# Patient Record
Sex: Female | Born: 1935
Health system: Southern US, Community
[De-identification: ages and names within clinical notes are randomized; demographics above are authoritative.]

## PROBLEM LIST (undated history)

## (undated) DIAGNOSIS — B019 Varicella without complication: Secondary | ICD-10-CM

## (undated) DIAGNOSIS — K5790 Diverticulosis of intestine, part unspecified, without perforation or abscess without bleeding: Secondary | ICD-10-CM

## (undated) DIAGNOSIS — I10 Essential (primary) hypertension: Secondary | ICD-10-CM

## (undated) DIAGNOSIS — L920 Granuloma annulare: Secondary | ICD-10-CM

## (undated) DIAGNOSIS — K635 Polyp of colon: Secondary | ICD-10-CM

## (undated) DIAGNOSIS — E119 Type 2 diabetes mellitus without complications: Secondary | ICD-10-CM

## (undated) DIAGNOSIS — M810 Age-related osteoporosis without current pathological fracture: Secondary | ICD-10-CM

## (undated) DIAGNOSIS — E7800 Pure hypercholesterolemia, unspecified: Secondary | ICD-10-CM

## (undated) DIAGNOSIS — D649 Anemia, unspecified: Secondary | ICD-10-CM

## (undated) DIAGNOSIS — C801 Malignant (primary) neoplasm, unspecified: Secondary | ICD-10-CM

## (undated) HISTORY — DX: Varicella without complication: B01.9

## (undated) HISTORY — DX: Age-related osteoporosis without current pathological fracture: M81.0

## (undated) HISTORY — DX: Granuloma annulare: L92.0

## (undated) HISTORY — DX: Polyp of colon: K63.5

## (undated) HISTORY — DX: Pure hypercholesterolemia, unspecified: E78.00

## (undated) HISTORY — DX: Malignant (primary) neoplasm, unspecified: C80.1

## (undated) HISTORY — DX: Diverticulosis of intestine, part unspecified, without perforation or abscess without bleeding: K57.90

## (undated) HISTORY — DX: Essential (primary) hypertension: I10

## (undated) HISTORY — DX: Anemia, unspecified: D64.9

## (undated) HISTORY — DX: Type 2 diabetes mellitus without complications: E11.9

---
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
## 1988-09-11 HISTORY — PX: OTHER SURGICAL HISTORY: SHX169

## 2005-04-26 ENCOUNTER — Ambulatory Visit: Payer: Self-pay | Admitting: Internal Medicine

## 2005-09-25 ENCOUNTER — Ambulatory Visit: Payer: Self-pay | Admitting: Internal Medicine

## 2006-06-07 ENCOUNTER — Ambulatory Visit: Payer: Self-pay | Admitting: Internal Medicine

## 2006-09-17 ENCOUNTER — Ambulatory Visit: Payer: Self-pay | Admitting: Gastroenterology

## 2007-02-12 ENCOUNTER — Ambulatory Visit: Payer: Self-pay | Admitting: Internal Medicine

## 2007-03-12 ENCOUNTER — Ambulatory Visit: Payer: Self-pay | Admitting: Internal Medicine

## 2007-06-11 ENCOUNTER — Ambulatory Visit: Payer: Self-pay | Admitting: Internal Medicine

## 2007-08-12 ENCOUNTER — Ambulatory Visit: Payer: Self-pay | Admitting: Internal Medicine

## 2007-08-21 ENCOUNTER — Ambulatory Visit: Payer: Self-pay | Admitting: Internal Medicine

## 2007-09-12 ENCOUNTER — Ambulatory Visit: Payer: Self-pay | Admitting: Internal Medicine

## 2007-11-10 ENCOUNTER — Ambulatory Visit: Payer: Self-pay | Admitting: Internal Medicine

## 2007-11-18 ENCOUNTER — Ambulatory Visit: Payer: Self-pay | Admitting: Internal Medicine

## 2007-12-11 ENCOUNTER — Ambulatory Visit: Payer: Self-pay | Admitting: Internal Medicine

## 2008-07-21 ENCOUNTER — Ambulatory Visit: Payer: Self-pay | Admitting: Internal Medicine

## 2010-03-17 ENCOUNTER — Ambulatory Visit: Payer: Self-pay | Admitting: Internal Medicine

## 2010-06-24 ENCOUNTER — Ambulatory Visit: Payer: Self-pay | Admitting: Internal Medicine

## 2010-10-14 ENCOUNTER — Ambulatory Visit: Payer: Self-pay | Admitting: Internal Medicine

## 2010-11-10 ENCOUNTER — Ambulatory Visit: Payer: Self-pay | Admitting: Internal Medicine

## 2011-01-05 ENCOUNTER — Ambulatory Visit: Payer: Self-pay | Admitting: Internal Medicine

## 2011-04-07 ENCOUNTER — Ambulatory Visit: Payer: Self-pay | Admitting: Internal Medicine

## 2012-01-05 ENCOUNTER — Ambulatory Visit: Payer: Self-pay | Admitting: Internal Medicine

## 2012-01-05 DIAGNOSIS — K7689 Other specified diseases of liver: Secondary | ICD-10-CM | POA: Diagnosis not present

## 2012-01-23 DIAGNOSIS — I10 Essential (primary) hypertension: Secondary | ICD-10-CM | POA: Diagnosis not present

## 2012-01-23 DIAGNOSIS — R7989 Other specified abnormal findings of blood chemistry: Secondary | ICD-10-CM | POA: Diagnosis not present

## 2012-01-23 DIAGNOSIS — E7800 Pure hypercholesterolemia, unspecified: Secondary | ICD-10-CM | POA: Diagnosis not present

## 2012-01-23 DIAGNOSIS — R5381 Other malaise: Secondary | ICD-10-CM | POA: Diagnosis not present

## 2012-01-23 DIAGNOSIS — R5383 Other fatigue: Secondary | ICD-10-CM | POA: Diagnosis not present

## 2012-03-20 DIAGNOSIS — I10 Essential (primary) hypertension: Secondary | ICD-10-CM | POA: Diagnosis not present

## 2012-03-20 DIAGNOSIS — R5383 Other fatigue: Secondary | ICD-10-CM | POA: Diagnosis not present

## 2012-03-20 DIAGNOSIS — R5381 Other malaise: Secondary | ICD-10-CM | POA: Diagnosis not present

## 2012-03-20 DIAGNOSIS — E7800 Pure hypercholesterolemia, unspecified: Secondary | ICD-10-CM | POA: Diagnosis not present

## 2012-04-04 DIAGNOSIS — Z1211 Encounter for screening for malignant neoplasm of colon: Secondary | ICD-10-CM | POA: Diagnosis not present

## 2012-04-16 ENCOUNTER — Ambulatory Visit: Payer: Self-pay | Admitting: Internal Medicine

## 2012-04-16 DIAGNOSIS — Z1231 Encounter for screening mammogram for malignant neoplasm of breast: Secondary | ICD-10-CM | POA: Diagnosis not present

## 2012-07-04 DIAGNOSIS — Z23 Encounter for immunization: Secondary | ICD-10-CM | POA: Diagnosis not present

## 2012-09-13 ENCOUNTER — Encounter: Payer: Self-pay | Admitting: Internal Medicine

## 2012-09-13 ENCOUNTER — Ambulatory Visit (INDEPENDENT_AMBULATORY_CARE_PROVIDER_SITE_OTHER): Payer: Medicare Other | Admitting: Internal Medicine

## 2012-09-13 VITALS — BP 124/72 | HR 83 | Temp 98.1°F | Ht 64.0 in | Wt 163.5 lb

## 2012-09-13 DIAGNOSIS — R5381 Other malaise: Secondary | ICD-10-CM

## 2012-09-13 DIAGNOSIS — M810 Age-related osteoporosis without current pathological fracture: Secondary | ICD-10-CM

## 2012-09-13 DIAGNOSIS — R7309 Other abnormal glucose: Secondary | ICD-10-CM | POA: Diagnosis not present

## 2012-09-13 DIAGNOSIS — E7800 Pure hypercholesterolemia, unspecified: Secondary | ICD-10-CM | POA: Diagnosis not present

## 2012-09-13 DIAGNOSIS — I10 Essential (primary) hypertension: Secondary | ICD-10-CM

## 2012-09-13 DIAGNOSIS — E119 Type 2 diabetes mellitus without complications: Secondary | ICD-10-CM | POA: Insufficient documentation

## 2012-09-13 DIAGNOSIS — D649 Anemia, unspecified: Secondary | ICD-10-CM | POA: Diagnosis not present

## 2012-09-13 DIAGNOSIS — R739 Hyperglycemia, unspecified: Secondary | ICD-10-CM

## 2012-09-13 DIAGNOSIS — R5383 Other fatigue: Secondary | ICD-10-CM

## 2012-09-15 ENCOUNTER — Encounter: Payer: Self-pay | Admitting: Internal Medicine

## 2012-09-15 ENCOUNTER — Telehealth: Payer: Self-pay | Admitting: Internal Medicine

## 2012-09-15 MED ORDER — FERROUS SULFATE 325 (65 FE) MG PO TABS: 325.0000 mg | ORAL_TABLET | Freq: Every day | ORAL | Status: DC

## 2012-09-15 MED ORDER — SIMVASTATIN 40 MG PO TABS: 40.0000 mg | ORAL_TABLET | Freq: Every evening | ORAL | Status: DC

## 2012-09-15 MED ORDER — TRIAMTERENE-HCTZ 37.5-25 MG PO TABS: ORAL_TABLET | ORAL | Status: DC

## 2012-09-15 NOTE — Assessment & Plan Note (Signed)
Dr Magdalen Spatz released her.  Recheck cbc.

## 2012-09-15 NOTE — Assessment & Plan Note (Signed)
Low cholesterol diet and exercise.  Continues on simvastatin.  Discussed changing meds (especially since she is on 40mg ) - she declines at this time.  Check lipid pane and liver function.

## 2012-09-15 NOTE — Telephone Encounter (Signed)
Refills sent in for simvastatin, triam/hctz and ferrous sulfate.

## 2012-09-15 NOTE — Progress Notes (Signed)
Subjective:    Patient ID: Tina Watson, female    DOB: [birth year], 79 y.o.   MRN: [id number]  HPI 79 year old female with past history of hypertension, hyperglycemia and hypercholesterolemia who comes in today for a scheduled follow up.  She states she has been doing relatively well.  Feels she is handling the stress of her husband's recent death relatively well.  She has noticed some right flank pain.  Started before Christmas.  Now notices if she turns and twists a certain way.  Not a constant pain  Has been doing some reaching and lifting with Christmas decorations.  States her blood pressure has been averaging in the 130s systolic readings.  Bowels doing well.  Urinating ok.  Overall she fees she is doing relatively well.    Past Medical History  Diagnosis Date  . Hypertension   . Hypercholesterolemia   . Granuloma annulare   . Osteoporosis   . Diverticulosis   . Anemia   . Diabetes mellitus without complication   . Cancer     Skin  . Colon polyps   . Chicken pox     Current Outpatient Prescriptions on File Prior to Visit  Medication Sig Dispense Refill  . Calcium Carbonate-Vitamin D (CALCIUM 600+D) 600-400 MG-UNIT per tablet Take 1 tablet by mouth daily.      . ferrous sulfate 325 (65 FE) MG tablet Take 325 mg by mouth daily.      . simvastatin (ZOCOR) 40 MG tablet Take 40 mg by mouth every evening.      Marland Kitchen telmisartan (MICARDIS) 40 MG tablet Take 40 mg by mouth daily.      Marland Kitchen triamterene-hydrochlorothiazide (MAXZIDE-25) 37.5-25 MG per tablet 1/2 tablet q day        Review of Systems Patient denies any headache, lightheadedness or dizziness. No sinus or allergy symptoms.  No chest pain, tightness or palpitations.  No increased shortness of breath, cough or congestion.  No nausea or vomiting.  No abdominal pain or cramping.  No bowel change, such as diarrhea, constipation, BRBPR or melana.  No urine change.  Flank pain as outlined.       Objective:   Physical Exam Filed Vitals:   09/13/12 1132  BP: 124/72  Pulse: 83  Temp: 98.1 F (62.63 C)   79 year old female in no acute distress.   HEENT:  Nares - clear.  OP- without lesions or erythema.  NECK:  Supple, nontender.  No audible bruit.   HEART:  Appears to be regular. LUNGS:  Without crackles or wheezing audible.  Respirations even and unlabored.   RADIAL PULSE:  Equal bilaterally.  ABDOMEN:  Soft, nontender.  No audible abdominal bruit.   BACK:  No significant pain to palpation over the back.  Some reproducible discomfort (minimal) with twisting her back.  No radiation of the pain.  EXTREMITIES:  No increased edema to be present.                     Assessment & Plan:  BACK PAIN.  Symptoms and exam as outlined.  Appears to be more msk in origin.  Tylenol as directed.  Stretches.  Follow.  Let me know if pain does not resolve.    GI.  Abdominal ultrasound 10/11 revealed a liver mass that was felt likely to be a harmatoma vs liver cyst.  Too small to characterize.  We have discussed further w/up including MRI, etc - to further evaluate.  She has  declined.  Had follow up ultrasound 4/12.  After speaking with radiology, they compared ultrasounds and felt the findings were c/w benign cysts.  Follow up ultrasound 4/13 - cysts (relatively stable).  Follow.  Colonoscopy 09/17/06 revealed diverticulosis with multiple nonbleeding colonic angioectasias.  Currently doing well.   CARDIOVASCULAR.  Stress test 10/06/10 negative.  ECHO 10/06/10 revealed EF 55%, mild mitral insufficiency and mild LVH. Currently asymptomatic.    HISTORY OF ABDOMINAL BRUIT.  Renal ultrasound and abdominal ultrasound revealed echodensity in the left kidney suspicious for a stone.  She has declined further w/up.  Ultrasounds otherwise negative.    HEALTH MAINTENANCE.  Physical 03/20/12.  She is s/p hysterectomy.  Colonoscopy as outlined.  Bone density 07/28/08 normal.  Obtain results of latest mammogram.

## 2012-09-15 NOTE — Assessment & Plan Note (Signed)
Bone density 07/28/08 normal.  Follow.   

## 2012-09-15 NOTE — Assessment & Plan Note (Signed)
Blood pressure under reasonable control. Same med regimen.  Follow.  Check metabolic panel.

## 2012-09-15 NOTE — Assessment & Plan Note (Signed)
Low carb diet and exercise.  Check metabolic panel and a1c.   

## 2012-09-25 ENCOUNTER — Other Ambulatory Visit (INDEPENDENT_AMBULATORY_CARE_PROVIDER_SITE_OTHER): Payer: Medicare Other

## 2012-09-25 DIAGNOSIS — R7309 Other abnormal glucose: Secondary | ICD-10-CM

## 2012-09-25 DIAGNOSIS — R5383 Other fatigue: Secondary | ICD-10-CM | POA: Diagnosis not present

## 2012-09-25 DIAGNOSIS — I10 Essential (primary) hypertension: Secondary | ICD-10-CM

## 2012-09-25 DIAGNOSIS — R5381 Other malaise: Secondary | ICD-10-CM | POA: Diagnosis not present

## 2012-09-25 DIAGNOSIS — E7800 Pure hypercholesterolemia, unspecified: Secondary | ICD-10-CM

## 2012-09-25 DIAGNOSIS — R739 Hyperglycemia, unspecified: Secondary | ICD-10-CM

## 2012-09-25 DIAGNOSIS — D649 Anemia, unspecified: Secondary | ICD-10-CM

## 2012-09-25 LAB — CBC WITH DIFFERENTIAL/PLATELET
Basophils Relative: 0.6 % (ref 0.0–3.0)
Eosinophils Absolute: 0.2 10*3/uL (ref 0.0–0.7)
HCT: 38.6 % (ref 36.0–46.0)
Hemoglobin: 13 g/dL (ref 12.0–15.0)
Lymphocytes Relative: 36.3 % (ref 12.0–46.0)
MCHC: 33.7 g/dL (ref 30.0–36.0)
MCV: 88.7 fl (ref 78.0–100.0)
Neutro Abs: 4.1 10*3/uL (ref 1.4–7.7)
RBC: 4.35 Mil/uL (ref 3.87–5.11)

## 2012-09-25 LAB — HEPATIC FUNCTION PANEL
ALT: 23 U/L (ref 0–35)
AST: 21 U/L (ref 0–37)
Albumin: 4 g/dL (ref 3.5–5.2)
Alkaline Phosphatase: 65 U/L (ref 39–117)
Bilirubin, Direct: 0.1 mg/dL (ref 0.0–0.3)
Total Bilirubin: 0.7 mg/dL (ref 0.3–1.2)
Total Protein: 7 g/dL (ref 6.0–8.3)

## 2012-09-25 LAB — LIPID PANEL
Cholesterol: 227 mg/dL — ABNORMAL HIGH (ref 0–200)
HDL: 70.4 mg/dL (ref >39.00)
Total CHOL/HDL Ratio: 3
Triglycerides: 260 mg/dL — ABNORMAL HIGH (ref 0.0–149.0)
VLDL: 52 mg/dL — ABNORMAL HIGH (ref 0.0–40.0)

## 2012-09-25 LAB — FERRITIN: Ferritin: 170.3 ng/mL (ref 10.0–291.0)

## 2012-09-25 LAB — BASIC METABOLIC PANEL
BUN: 28 mg/dL — ABNORMAL HIGH (ref 6–23)
CO2: 31 mEq/L (ref 19–32)
Chloride: 101 mEq/L (ref 96–112)
Creatinine, Ser: 1 mg/dL (ref 0.4–1.2)

## 2012-09-25 LAB — LDL CHOLESTEROL, DIRECT: Direct LDL: 111.3 mg/dL

## 2012-10-09 DIAGNOSIS — H40009 Preglaucoma, unspecified, unspecified eye: Secondary | ICD-10-CM | POA: Diagnosis not present

## 2012-10-14 ENCOUNTER — Other Ambulatory Visit: Payer: Self-pay | Admitting: Internal Medicine

## 2012-10-14 MED ORDER — TELMISARTAN 40 MG PO TABS: 40.0000 mg | ORAL_TABLET | Freq: Every day | ORAL | Status: DC

## 2012-10-14 NOTE — Telephone Encounter (Signed)
Pt called needing refill on [facility] [place]

## 2012-10-14 NOTE — Telephone Encounter (Signed)
Sent in to pharmacy.  

## 2012-11-11 DIAGNOSIS — H40009 Preglaucoma, unspecified, unspecified eye: Secondary | ICD-10-CM | POA: Diagnosis not present

## 2012-12-11 ENCOUNTER — Telehealth: Payer: Self-pay | Admitting: Internal Medicine

## 2012-12-11 NOTE — Telephone Encounter (Signed)
Called pharmacy to see if they had received rx and pharmacist stated that rx has been on hold since January.

## 2012-12-11 NOTE — Telephone Encounter (Signed)
Pt is needing refills on Simvistatin pt uses [facility] in [facility]

## 2013-03-24 ENCOUNTER — Ambulatory Visit (INDEPENDENT_AMBULATORY_CARE_PROVIDER_SITE_OTHER): Payer: Medicare Other | Admitting: Internal Medicine

## 2013-03-24 ENCOUNTER — Encounter: Payer: Self-pay | Admitting: Internal Medicine

## 2013-03-24 VITALS — BP 130/70 | HR 89 | Temp 98.1°F | Ht 64.0 in | Wt 164.0 lb

## 2013-03-24 DIAGNOSIS — E7800 Pure hypercholesterolemia, unspecified: Secondary | ICD-10-CM

## 2013-03-24 DIAGNOSIS — I10 Essential (primary) hypertension: Secondary | ICD-10-CM | POA: Diagnosis not present

## 2013-03-24 DIAGNOSIS — D649 Anemia, unspecified: Secondary | ICD-10-CM | POA: Diagnosis not present

## 2013-03-24 DIAGNOSIS — M810 Age-related osteoporosis without current pathological fracture: Secondary | ICD-10-CM | POA: Diagnosis not present

## 2013-03-24 DIAGNOSIS — R7309 Other abnormal glucose: Secondary | ICD-10-CM

## 2013-03-24 DIAGNOSIS — R5381 Other malaise: Secondary | ICD-10-CM

## 2013-03-24 DIAGNOSIS — R739 Hyperglycemia, unspecified: Secondary | ICD-10-CM

## 2013-03-24 DIAGNOSIS — R5383 Other fatigue: Secondary | ICD-10-CM | POA: Diagnosis not present

## 2013-03-24 LAB — LIPID PANEL
Cholesterol: 221 mg/dL — ABNORMAL HIGH (ref 0–200)
HDL: 63.7 mg/dL (ref >39.00)
Triglycerides: 311 mg/dL — ABNORMAL HIGH (ref 0.0–149.0)

## 2013-03-24 LAB — HEPATIC FUNCTION PANEL
ALT: 28 U/L (ref 0–35)
AST: 24 U/L (ref 0–37)
Albumin: 4.4 g/dL (ref 3.5–5.2)

## 2013-03-24 LAB — BASIC METABOLIC PANEL
Calcium: 9.9 mg/dL (ref 8.4–10.5)
Chloride: 100 mEq/L (ref 96–112)
Creatinine, Ser: 1 mg/dL (ref 0.4–1.2)
Sodium: 138 mEq/L (ref 135–145)

## 2013-03-24 LAB — CBC WITH DIFFERENTIAL/PLATELET
Basophils Relative: 0.5 % (ref 0.0–3.0)
Eosinophils Relative: 3.2 % (ref 0.0–5.0)
HCT: 38.4 % (ref 36.0–46.0)
Lymphs Abs: 3.1 10*3/uL (ref 0.7–4.0)
MCV: 90.2 fl (ref 78.0–100.0)
Monocytes Absolute: 0.8 10*3/uL (ref 0.1–1.0)
Monocytes Relative: 9.1 % (ref 3.0–12.0)
Platelets: 245 10*3/uL (ref 150.0–400.0)
RBC: 4.26 Mil/uL (ref 3.87–5.11)
WBC: 8.6 10*3/uL (ref 4.5–10.5)

## 2013-03-24 LAB — TSH: TSH: 2.8 u[IU]/mL (ref 0.35–5.50)

## 2013-03-24 LAB — HEMOGLOBIN A1C: Hgb A1c MFr Bld: 6.7 % — ABNORMAL HIGH (ref 4.6–6.5)

## 2013-03-25 ENCOUNTER — Encounter: Payer: Self-pay | Admitting: *Deleted

## 2013-03-26 ENCOUNTER — Encounter: Payer: Self-pay | Admitting: Internal Medicine

## 2013-03-26 NOTE — Assessment & Plan Note (Signed)
Bone density 07/28/08 normal.  Follow.   

## 2013-03-26 NOTE — Assessment & Plan Note (Signed)
Dr Yabanez released her.  Follow cbc.    

## 2013-03-26 NOTE — Assessment & Plan Note (Signed)
Blood pressure under reasonable control. Same med regimen.  Follow.  Check metabolic panel.

## 2013-03-26 NOTE — Assessment & Plan Note (Signed)
Low cholesterol diet and exercise.  Continues on simvastatin.  Have discussed changing meds (especially since she is on 40mg ) - she has declined.  Check lipid pane and liver function.

## 2013-03-26 NOTE — Assessment & Plan Note (Signed)
Low carb diet and exercise.  Check metabolic panel and a1c.   

## 2013-03-26 NOTE — Progress Notes (Signed)
Subjective:    Patient ID: Tina Watson, female    DOB: 05-Dec-1927, 79 y.o.   MRN: [id number]  HPI 79 year old female with past history of hypertension, hyperglycemia and hypercholesterolemia who comes in today to follow up on these issues as well as for a complete physical exam.  She states she has been doing relatively well.  Feels she is handling the stress of her husband's death relatively well.  No chest pain or tightness.  Breathing stable.  Bowels doing well.  Urinating ok.  She does report some fatigue.  Feels lazy.  Eating and drinking well.  Stays busy.      Past Medical History  Diagnosis Date  . Hypertension   . Hypercholesterolemia   . Granuloma annulare   . Osteoporosis   . Diverticulosis   . Anemia   . Diabetes mellitus without complication   . Cancer     Skin  . Colon polyps   . Chicken pox     Current Outpatient Prescriptions on File Prior to Visit  Medication Sig Dispense Refill  . Calcium Carbonate-Vitamin D (CALCIUM 600+D) 600-400 MG-UNIT per tablet Take 1 tablet by mouth daily.      . ferrous sulfate 325 (65 FE) MG tablet Take 1 tablet (325 mg total) by mouth daily.  30 tablet  6  . fish oil-omega-3 fatty acids 1000 MG capsule Take 1 g by mouth daily.      Marland Kitchen glucose blood (BAYER CONTOUR NEXT TEST) test strip 1 each by Other route as needed. Use as instructed      . Multiple Vitamin (MULTIVITAMIN) tablet Take 1 tablet by mouth daily.      . simvastatin (ZOCOR) 40 MG tablet Take 1 tablet (40 mg total) by mouth every evening.  30 tablet  6  . telmisartan (MICARDIS) 40 MG tablet Take 1 tablet (40 mg total) by mouth daily.  30 tablet  5  . triamterene-hydrochlorothiazide (MAXZIDE-25) 37.5-25 MG per tablet 1/2 tablet q day  30 tablet  6   No current facility-administered medications on file prior to visit.    Review of Systems Patient denies any headache, lightheadedness or dizziness. No sinus or allergy symptoms.  No chest pain, tightness or palpitations.  No  increased shortness of breath, cough or congestion.  No nausea or vomiting.  No abdominal pain or cramping.  No bowel change, such as diarrhea, constipation, BRBPR or melana.  No urine change.  Some fatigue as outlined.       Objective:   Physical Exam  Filed Vitals:   03/24/13 1052  BP: 130/70  Pulse: 89  Temp: 98.1 F (36.7 C)   Blood pressure recheck:  142/78, pulse 55  79 year old female in no acute distress.   HEENT:  Nares- clear.  Oropharynx - without lesions. NECK:  Supple.  Nontender.  No audible bruit.  HEART:  Appears to be regular. LUNGS:  No crackles or wheezing audible.  Respirations even and unlabored.  RADIAL PULSE:  Equal bilaterally.    BREASTS:  No nipple discharge or nipple retraction present.  Could not appreciate any distinct nodules or axillary adenopathy.  ABDOMEN:  Soft, nontender.  Bowel sounds present and normal.  No audible abdominal bruit.  GU:  She declined.    EXTREMITIES:  No increased edema present.  DP pulses palpable and equal bilaterally.            Assessment & Plan:  FATIGUE.  Check cbc, metabolic panel and tsh.  GI.  Abdominal ultrasound 10/11 revealed a liver mass that was felt likely to be a harmatoma vs liver cyst.  Too small to characterize.  We have discussed further w/up including MRI, etc - to further evaluate.  She has declined.  Had follow up ultrasound 4/12.  After speaking with radiology, they compared ultrasounds and felt the findings were c/w benign cysts.  Follow up ultrasound 4/13 - cysts (relatively stable).  Follow.  Colonoscopy 09/17/06 revealed diverticulosis with multiple nonbleeding colonic angioectasias.  Currently doing well.   CARDIOVASCULAR.  Stress test 10/06/10 negative.  ECHO 10/06/10 revealed EF 55%, mild mitral insufficiency and mild LVH. Currently asymptomatic.    HISTORY OF ABDOMINAL BRUIT.  Renal ultrasound and abdominal ultrasound revealed echodensity in the left kidney suspicious for a stone.  She has declined  further w/up.  Ultrasounds otherwise negative.    HEALTH MAINTENANCE.  Physical today.  She is s/p hysterectomy.  Colonoscopy as outlined.  Bone density 07/28/08 normal.  Scheduled a follow up mammogram.

## 2013-04-21 ENCOUNTER — Other Ambulatory Visit: Payer: Self-pay | Admitting: *Deleted

## 2013-04-21 MED ORDER — TELMISARTAN 40 MG PO TABS: 40.0000 mg | ORAL_TABLET | Freq: Every day | ORAL | Status: DC

## 2013-05-09 ENCOUNTER — Ambulatory Visit: Payer: Self-pay | Admitting: Internal Medicine

## 2013-05-09 DIAGNOSIS — Z1231 Encounter for screening mammogram for malignant neoplasm of breast: Secondary | ICD-10-CM | POA: Diagnosis not present

## 2013-05-13 ENCOUNTER — Encounter: Payer: Self-pay | Admitting: Internal Medicine

## 2013-05-13 DIAGNOSIS — H40009 Preglaucoma, unspecified, unspecified eye: Secondary | ICD-10-CM | POA: Diagnosis not present

## 2013-05-13 DIAGNOSIS — Z86010 Personal history of colonic polyps: Secondary | ICD-10-CM

## 2013-05-18 ENCOUNTER — Other Ambulatory Visit: Payer: Self-pay | Admitting: Internal Medicine

## 2013-06-25 ENCOUNTER — Encounter: Payer: Self-pay | Admitting: Internal Medicine

## 2013-06-25 ENCOUNTER — Encounter (INDEPENDENT_AMBULATORY_CARE_PROVIDER_SITE_OTHER): Payer: Self-pay

## 2013-06-25 ENCOUNTER — Ambulatory Visit (INDEPENDENT_AMBULATORY_CARE_PROVIDER_SITE_OTHER): Payer: Medicare Other | Admitting: Internal Medicine

## 2013-06-25 VITALS — BP 122/62 | HR 81 | Temp 97.9°F | Ht 64.0 in | Wt 161.8 lb

## 2013-06-25 DIAGNOSIS — M810 Age-related osteoporosis without current pathological fracture: Secondary | ICD-10-CM | POA: Diagnosis not present

## 2013-06-25 DIAGNOSIS — I10 Essential (primary) hypertension: Secondary | ICD-10-CM

## 2013-06-25 DIAGNOSIS — L989 Disorder of the skin and subcutaneous tissue, unspecified: Secondary | ICD-10-CM | POA: Diagnosis not present

## 2013-06-25 DIAGNOSIS — D649 Anemia, unspecified: Secondary | ICD-10-CM

## 2013-06-25 DIAGNOSIS — E7800 Pure hypercholesterolemia, unspecified: Secondary | ICD-10-CM

## 2013-06-25 DIAGNOSIS — E119 Type 2 diabetes mellitus without complications: Secondary | ICD-10-CM

## 2013-06-25 DIAGNOSIS — H409 Unspecified glaucoma: Secondary | ICD-10-CM

## 2013-06-25 DIAGNOSIS — Z23 Encounter for immunization: Secondary | ICD-10-CM | POA: Diagnosis not present

## 2013-06-29 ENCOUNTER — Encounter: Payer: Self-pay | Admitting: Internal Medicine

## 2013-06-29 DIAGNOSIS — H409 Unspecified glaucoma: Secondary | ICD-10-CM | POA: Insufficient documentation

## 2013-06-29 DIAGNOSIS — L989 Disorder of the skin and subcutaneous tissue, unspecified: Secondary | ICD-10-CM | POA: Insufficient documentation

## 2013-06-29 NOTE — Assessment & Plan Note (Signed)
Low carb diet and exercise.  Follow metabolic panel and a1c.  Last a1c 6.7.  Discussed diet and exercise.  Follow.  Up to date with eye exams.    

## 2013-06-29 NOTE — Assessment & Plan Note (Signed)
Low cholesterol diet and exercise.  Continues on simvastatin.  Have discussed changing meds (especially since she is on 40mg) - she has declined.  Check lipid panel and liver function.   

## 2013-06-29 NOTE — Assessment & Plan Note (Signed)
Blood pressure under reasonable control.  Same med regimen.  Follow.  Follow metabolic panel.    

## 2013-06-29 NOTE — Progress Notes (Signed)
Subjective:    Patient ID: Tina Watson, female    DOB: 1928/03/01, 79 y.o.   MRN: [id number]  HPI 79 year old female with past history of hypertension, hyperglycemia and hypercholesterolemia who comes in today for a scheduled follow up.   She states she has been doing relatively well.  Feels she is handling the stress of her husband's death relatively well.  No chest pain or tightness.  Breathing stable.  Bowels doing well.  Urinating ok.   Eating and drinking well.  Stays busy.  Has been seeing Dr Fransico Michael for borderline glaucoma.  Is due f/u in 11/14.  Last seen 1-2 weeks ago.  She has been to [facility].  Discussed trying to watch her diet.  States her blood pressures have been averaging 130's/70-80s.  Overall she feels she is doing well.       Past Medical History  Diagnosis Date  . Hypertension   . Hypercholesterolemia   . Granuloma annulare   . Osteoporosis   . Diverticulosis   . Anemia   . Diabetes mellitus without complication   . Cancer     Skin  . Colon polyps   . Chicken pox     Current Outpatient Prescriptions on File Prior to Visit  Medication Sig Dispense Refill  . Calcium Carbonate-Vitamin D (CALCIUM 600+D) 600-400 MG-UNIT per tablet Take 1 tablet by mouth daily.      . ferrous sulfate 325 (65 FE) MG tablet TAKE 1 TABLET (325 MG TOTAL) BY MOUTH DAILY.  30 tablet  5  . fish oil-omega-3 fatty acids 1000 MG capsule Take 1 g by mouth daily.      Marland Kitchen glucose blood (BAYER CONTOUR NEXT TEST) test strip 1 each by Other route as needed. Use as instructed      . Multiple Vitamin (MULTIVITAMIN) tablet Take 1 tablet by mouth daily.      . simvastatin (ZOCOR) 40 MG tablet Take 1 tablet (40 mg total) by mouth every evening.  30 tablet  6  . telmisartan (MICARDIS) 40 MG tablet Take 1 tablet (40 mg total) by mouth daily.  30 tablet  5  . triamterene-hydrochlorothiazide (MAXZIDE-25) 37.5-25 MG per tablet 1/2 tablet q day  30 tablet  6   No current facility-administered medications on file  prior to visit.    Review of Systems Patient denies any headache, lightheadedness or dizziness. No sinus or allergy symptoms.  No chest pain, tightness or palpitations.  No increased shortness of breath, cough or congestion.  No nausea or vomiting.  No abdominal pain or cramping.  No bowel change, such as diarrhea, constipation, BRBPR or melana.  No urine change.  Overall she feels she is doing well.      Objective:   Physical Exam  Filed Vitals:   06/25/13 0910  BP: 122/62  Pulse: 81  Temp: 97.9 F (65.78 C)   79 year old female in no acute distress.   HEENT:  Nares- clear.  Oropharynx - without lesions. NECK:  Supple.  Nontender.  No audible bruit.  HEART:  Appears to be regular. LUNGS:  No crackles or wheezing audible.  Respirations even and unlabored.  RADIAL PULSE:  Equal bilaterally.     ABDOMEN:  Soft, nontender.  Bowel sounds present and normal.  No audible abdominal bruit.   EXTREMITIES:  No increased edema present.  DP pulses palpable and equal bilaterally.            Assessment & Plan:  GI.  Abdominal ultrasound  10/11 revealed a liver mass that was felt likely to be a harmatoma vs liver cyst.  Too small to characterize.  We have discussed further w/up including MRI, etc - to further evaluate.  She has declined.  Had follow up ultrasound 4/12.  After speaking with radiology, they compared ultrasounds and felt the findings were c/w benign cysts.  Follow up ultrasound 4/13 - cysts (relatively stable).  Follow.  Colonoscopy 09/17/06 revealed diverticulosis with multiple nonbleeding colonic angioectasias.  Currently doing well.   CARDIOVASCULAR.  Stress test 10/06/10 negative.  ECHO 10/06/10 revealed EF 55%, mild mitral insufficiency and mild LVH. Currently asymptomatic.    HISTORY OF ABDOMINAL BRUIT.  Renal ultrasound and abdominal ultrasound revealed echodensity in the left kidney suspicious for a stone.  She has declined further w/up.  Ultrasounds otherwise negative.    HEALTH  MAINTENANCE.  Physical 03/24/13.    She is s/p hysterectomy.  Colonoscopy as outlined.  Bone density 07/28/08 normal.  Mammogram 05/09/13 - Birads I.

## 2013-06-29 NOTE — Assessment & Plan Note (Signed)
Persistent left leg lesion.  Refer to dermatology.

## 2013-06-29 NOTE — Assessment & Plan Note (Signed)
Dr Yabanez released her.  Follow cbc.    

## 2013-06-29 NOTE — Assessment & Plan Note (Signed)
Borderline glaucoma.  Followed by Dr Brennan.    

## 2013-06-29 NOTE — Assessment & Plan Note (Signed)
Bone density 07/28/08 normal.  Follow.   

## 2013-07-01 DIAGNOSIS — Z85828 Personal history of other malignant neoplasm of skin: Secondary | ICD-10-CM | POA: Diagnosis not present

## 2013-07-01 DIAGNOSIS — L821 Other seborrheic keratosis: Secondary | ICD-10-CM | POA: Diagnosis not present

## 2013-07-01 DIAGNOSIS — L538 Other specified erythematous conditions: Secondary | ICD-10-CM | POA: Diagnosis not present

## 2013-07-01 DIAGNOSIS — Z1283 Encounter for screening for malignant neoplasm of skin: Secondary | ICD-10-CM | POA: Diagnosis not present

## 2013-07-01 DIAGNOSIS — L570 Actinic keratosis: Secondary | ICD-10-CM | POA: Diagnosis not present

## 2013-07-15 ENCOUNTER — Other Ambulatory Visit: Payer: Self-pay | Admitting: Internal Medicine

## 2013-07-30 ENCOUNTER — Other Ambulatory Visit (INDEPENDENT_AMBULATORY_CARE_PROVIDER_SITE_OTHER): Payer: Medicare Other

## 2013-07-30 DIAGNOSIS — E119 Type 2 diabetes mellitus without complications: Secondary | ICD-10-CM

## 2013-07-30 DIAGNOSIS — E7800 Pure hypercholesterolemia, unspecified: Secondary | ICD-10-CM | POA: Diagnosis not present

## 2013-07-30 LAB — BASIC METABOLIC PANEL
BUN: 30 mg/dL — ABNORMAL HIGH (ref 6–23)
CO2: 31 mEq/L (ref 19–32)
Calcium: 9.6 mg/dL (ref 8.4–10.5)
Chloride: 101 mEq/L (ref 96–112)
Creatinine, Ser: 1 mg/dL (ref 0.4–1.2)
GFR: 55.52 mL/min — ABNORMAL LOW (ref >60.00)
Glucose, Bld: 117 mg/dL — ABNORMAL HIGH (ref 70–99)
Potassium: 3.9 mEq/L (ref 3.5–5.1)

## 2013-07-30 LAB — LIPID PANEL
HDL: 61.4 mg/dL (ref >39.00)
VLDL: 64.4 mg/dL — ABNORMAL HIGH (ref 0.0–40.0)

## 2013-07-30 LAB — HEPATIC FUNCTION PANEL
ALT: 22 U/L (ref 0–35)
Bilirubin, Direct: 0.1 mg/dL (ref 0.0–0.3)
Total Bilirubin: 1.1 mg/dL (ref 0.3–1.2)

## 2013-07-30 LAB — HEMOGLOBIN A1C: Hgb A1c MFr Bld: 6.6 % — ABNORMAL HIGH (ref 4.6–6.5)

## 2013-07-30 LAB — MICROALBUMIN / CREATININE URINE RATIO: Microalb, Ur: 1.3 mg/dL (ref 0.0–1.9)

## 2013-07-30 LAB — LDL CHOLESTEROL, DIRECT: Direct LDL: 114.1 mg/dL

## 2013-07-31 ENCOUNTER — Encounter: Payer: Self-pay | Admitting: *Deleted

## 2013-08-18 DIAGNOSIS — H2510 Age-related nuclear cataract, unspecified eye: Secondary | ICD-10-CM | POA: Diagnosis not present

## 2013-09-29 ENCOUNTER — Telehealth: Payer: Self-pay | Admitting: Internal Medicine

## 2013-09-29 NOTE — Telephone Encounter (Signed)
Patient Information:  Caller Name: Milderd  Phone: [phone]  Patient: Tina Watson, Tina Watson  Gender: Female  DOB: [birth year]  Age: 80 Years  PCP: Einar Pheasant  Office Follow Up:  Does the office need to follow up with this patient?: Yes  Instructions For The Office: Disposition is see today. No appts available. Please call and advise pt.   Symptoms  Reason For Call & Symptoms: Pt had developed hard stools last week on Wed 09/24/13 - she passed the hard pellet stools. This was followed later by a large evacuation of the bowels with blood at the end of the stool that filled up a panty liner and got some on her pants. She does not take a blood thinner. Pt has had a normal bm since and feels this might be related to an internal hemmorhoid.  Reviewed Health History In EMR: Yes  Reviewed Medications In EMR: Yes  Reviewed Allergies In EMR: Yes  Reviewed Surgeries / Procedures: Yes  Date of Onset of Symptoms: 09/25/2013  Guideline(s) Used:  Rectal Bleeding  Disposition Per Guideline:   See Today in Office  Reason For Disposition Reached:   All other patients with rectal bleeding (Exceptions: blood just on toilet paper, few drops, streaks on surface of normal formed BM)  Advice Given:  N/A  Patient Will Follow Care Advice:  YES

## 2013-10-20 ENCOUNTER — Other Ambulatory Visit: Payer: Self-pay | Admitting: Internal Medicine

## 2013-10-29 ENCOUNTER — Ambulatory Visit (INDEPENDENT_AMBULATORY_CARE_PROVIDER_SITE_OTHER): Payer: Medicare Other | Admitting: Internal Medicine

## 2013-10-29 ENCOUNTER — Encounter: Payer: Self-pay | Admitting: Internal Medicine

## 2013-10-29 ENCOUNTER — Encounter (INDEPENDENT_AMBULATORY_CARE_PROVIDER_SITE_OTHER): Payer: Self-pay

## 2013-10-29 VITALS — BP 120/64 | HR 85 | Temp 97.7°F | Ht 64.0 in | Wt 164.5 lb

## 2013-10-29 DIAGNOSIS — I10 Essential (primary) hypertension: Secondary | ICD-10-CM | POA: Diagnosis not present

## 2013-10-29 DIAGNOSIS — D649 Anemia, unspecified: Secondary | ICD-10-CM | POA: Diagnosis not present

## 2013-10-29 DIAGNOSIS — M810 Age-related osteoporosis without current pathological fracture: Secondary | ICD-10-CM

## 2013-10-29 DIAGNOSIS — E7800 Pure hypercholesterolemia, unspecified: Secondary | ICD-10-CM | POA: Diagnosis not present

## 2013-10-29 DIAGNOSIS — H409 Unspecified glaucoma: Secondary | ICD-10-CM

## 2013-10-29 DIAGNOSIS — R109 Unspecified abdominal pain: Secondary | ICD-10-CM

## 2013-10-29 DIAGNOSIS — E119 Type 2 diabetes mellitus without complications: Secondary | ICD-10-CM | POA: Diagnosis not present

## 2013-10-29 NOTE — Progress Notes (Signed)
Subjective:    Patient ID: Tina Watson, female    DOB: 12-11-1927, 81 y.o.   MRN: [id number]  HPI 80 year old female with past history of hypertension, hyperglycemia and hypercholesterolemia who comes in today for a scheduled follow up.   She states she has been doing relatively well.  No chest pain or tightness.  Breathing stable.  Bowels doing well.  Urinating ok.   Eating and drinking well.  She has been to [facility].  Discussed trying to watch her diet.  Has noticed one spot on her abdomen that is sore to touch.  Has been present for one year.  No change.  Does not worsen with eating.  No significant pain.  She does report that one month ago she noticed some bright red blood per rectum.  Some "pellet stools" at times.  Soft.  Noticed blood on her panty liner.  No blood in the stool or toilet.  No bleeding since.  Had colonoscopy in 2008. Eating and drinking well.  No nausea or vomiting.        Past Medical History  Diagnosis Date  . Hypertension   . Hypercholesterolemia   . Granuloma annulare   . Osteoporosis   . Diverticulosis   . Anemia   . Diabetes mellitus without complication   . Cancer     Skin  . Colon polyps   . Chicken pox     Current Outpatient Prescriptions on File Prior to Visit  Medication Sig Dispense Refill  . Calcium Carbonate-Vitamin D (CALCIUM 600+D) 600-400 MG-UNIT per tablet Take 1 tablet by mouth daily.      . ferrous sulfate 325 (65 FE) MG tablet TAKE 1 TABLET (325 MG TOTAL) BY MOUTH DAILY.  30 tablet  5  . fish oil-omega-3 fatty acids 1000 MG capsule Take 1 g by mouth daily.      Marland Kitchen glucose blood (BAYER CONTOUR NEXT TEST) test strip 1 each by Other route as needed. Use as instructed      . Multiple Vitamin (MULTIVITAMIN) tablet Take 1 tablet by mouth daily.      . simvastatin (ZOCOR) 40 MG tablet TAKE 1 TABLET (40 MG TOTAL) BY MOUTH EVERY EVENING.  30 tablet  5  . telmisartan (MICARDIS) 40 MG tablet Take 1 tablet (40 mg total) by mouth daily.  30 tablet  5  .  triamterene-hydrochlorothiazide (MAXZIDE-25) 37.5-25 MG per tablet TAKE 1/2 TABLET ONCE A DAY  30 tablet  2   No current facility-administered medications on file prior to visit.    Review of Systems Patient denies any headache, lightheadedness or dizziness. No sinus or allergy symptoms.  No chest pain, tightness or palpitations.  No increased shortness of breath, cough or congestion.  No nausea or vomiting.  Abdominal soreness as outlined.  BRBPR as outlined.  None in the last one month.  Occurred on one occasion last month.   No urine change.  Overall she feels she is doing well.  Eating and drinking well.       Objective:   Physical Exam  Filed Vitals:   10/29/13 1342  BP: 120/64  Pulse: 85  Temp: 97.7 F (36.5 C)   Blood pressure recheck:  80/30  80 year old female in no acute distress.   HEENT:  Nares- clear.  Oropharynx - without lesions. NECK:  Supple.  Nontender.  No audible bruit.  HEART:  Appears to be regular. LUNGS:  No crackles or wheezing audible.  Respirations even and unlabored.  RADIAL PULSE:  Equal bilaterally.     ABDOMEN:  Soft, nontender.  Bowel sounds present and normal.  No audible abdominal bruit.  RECTAL:  No mass.  Heme negative.    EXTREMITIES:  No increased edema present.  DP pulses palpable and equal bilaterally.  FEET:  No lesions.             Assessment & Plan:  GI.  Abdominal ultrasound 10/11 revealed a liver mass that was felt likely to be a harmatoma vs liver cyst.  Too small to characterize.  We have discussed further w/up including MRI, etc - to further evaluate.  She has declined.  Had follow up ultrasound 4/12.  After speaking with radiology, they compared ultrasounds and felt the findings were c/w benign cysts.  Follow up ultrasound 4/13 - cysts (relatively stable).  Follow.  Colonoscopy 09/17/06 revealed diverticulosis with multiple nonbleeding colonic angioectasias.  Abdominal soreness as outlined.  Desires no further intervention or w/up.   BRBPR as outlined.  Declines any further w/up.    CARDIOVASCULAR.  Stress test 10/06/10 negative.  ECHO 10/06/10 revealed EF 55%, mild mitral insufficiency and mild LVH. Currently asymptomatic.    HISTORY OF ABDOMINAL BRUIT.  Renal ultrasound and abdominal ultrasound revealed echodensity in the left kidney suspicious for a stone.  She has declined further w/up.  Ultrasounds otherwise negative.    HEALTH MAINTENANCE.  Physical 03/24/13.    She is s/p hysterectomy.  Colonoscopy as outlined.  Bone density 07/28/08 normal.  Mammogram 05/09/13 - Birads I.

## 2013-10-30 ENCOUNTER — Encounter: Payer: Self-pay | Admitting: Internal Medicine

## 2013-10-30 ENCOUNTER — Other Ambulatory Visit: Payer: Self-pay | Admitting: *Deleted

## 2013-10-30 MED ORDER — TELMISARTAN 40 MG PO TABS: 40.0000 mg | ORAL_TABLET | Freq: Every day | ORAL | Status: DC

## 2013-10-30 MED ORDER — SIMVASTATIN 40 MG PO TABS: ORAL_TABLET | ORAL | Status: DC

## 2013-10-30 NOTE — Assessment & Plan Note (Signed)
Blood pressure under reasonable control.  Same med regimen.  Follow.  Follow metabolic panel.    

## 2013-10-30 NOTE — Assessment & Plan Note (Signed)
Dr Vladimir Creeks released her.  Follow cbc.

## 2013-10-30 NOTE — Assessment & Plan Note (Signed)
Low carb diet and exercise.  Follow metabolic panel and K9T.  Last a1c 6.7.  Discussed diet and exercise.  Follow.  Up to date with eye exams.

## 2013-10-30 NOTE — Assessment & Plan Note (Signed)
Borderline glaucoma.  Followed by Dr Brennan.    

## 2013-10-30 NOTE — Assessment & Plan Note (Signed)
Bone density 07/28/08 normal.  Follow.   

## 2013-10-30 NOTE — Assessment & Plan Note (Signed)
Low cholesterol diet and exercise.  Continues on simvastatin.  Have discussed changing meds (especially since she is on 40mg ) - she has declined.  Check lipid panel and liver function.

## 2013-11-04 ENCOUNTER — Other Ambulatory Visit (INDEPENDENT_AMBULATORY_CARE_PROVIDER_SITE_OTHER): Payer: Medicare Other

## 2013-11-04 DIAGNOSIS — R109 Unspecified abdominal pain: Secondary | ICD-10-CM | POA: Diagnosis not present

## 2013-11-04 DIAGNOSIS — E119 Type 2 diabetes mellitus without complications: Secondary | ICD-10-CM | POA: Diagnosis not present

## 2013-11-04 DIAGNOSIS — I10 Essential (primary) hypertension: Secondary | ICD-10-CM

## 2013-11-04 DIAGNOSIS — E7800 Pure hypercholesterolemia, unspecified: Secondary | ICD-10-CM

## 2013-11-04 LAB — CBC WITH DIFFERENTIAL/PLATELET
BASOS PCT: 0.5 % (ref 0.0–3.0)
Basophils Absolute: 0 10*3/uL (ref 0.0–0.1)
EOS ABS: 0.2 10*3/uL (ref 0.0–0.7)
Eosinophils Relative: 2.8 % (ref 0.0–5.0)
HEMATOCRIT: 37.6 % (ref 36.0–46.0)
HEMOGLOBIN: 12.5 g/dL (ref 12.0–15.0)
LYMPHS ABS: 3 10*3/uL (ref 0.7–4.0)
Lymphocytes Relative: 38.9 % (ref 12.0–46.0)
MCHC: 33.3 g/dL (ref 30.0–36.0)
MCV: 90.2 fl (ref 78.0–100.0)
Monocytes Absolute: 0.7 10*3/uL (ref 0.1–1.0)
Monocytes Relative: 9.4 % (ref 3.0–12.0)
NEUTROS ABS: 3.8 10*3/uL (ref 1.4–7.7)
Neutrophils Relative %: 48.4 % (ref 43.0–77.0)
Platelets: 269 10*3/uL (ref 150.0–400.0)
RBC: 4.17 Mil/uL (ref 3.87–5.11)
RDW: 13.3 % (ref 11.5–14.6)
WBC: 7.8 10*3/uL (ref 4.5–10.5)

## 2013-11-04 LAB — LIPID PANEL
CHOLESTEROL: 223 mg/dL — AB (ref 0–200)
HDL: 63.1 mg/dL (ref >39.00)
Total CHOL/HDL Ratio: 4
Triglycerides: 353 mg/dL — ABNORMAL HIGH (ref 0.0–149.0)
VLDL: 70.6 mg/dL — ABNORMAL HIGH (ref 0.0–40.0)

## 2013-11-04 LAB — BASIC METABOLIC PANEL
BUN: 24 mg/dL — ABNORMAL HIGH (ref 6–23)
CHLORIDE: 105 meq/L (ref 96–112)
CO2: 29 meq/L (ref 19–32)
Calcium: 9.7 mg/dL (ref 8.4–10.5)
Creatinine, Ser: 1.1 mg/dL (ref 0.4–1.2)
GFR: 53.05 mL/min — ABNORMAL LOW (ref >60.00)
Glucose, Bld: 105 mg/dL — ABNORMAL HIGH (ref 70–99)
POTASSIUM: 4.1 meq/L (ref 3.5–5.1)
SODIUM: 141 meq/L (ref 135–145)

## 2013-11-04 LAB — HEPATIC FUNCTION PANEL
ALBUMIN: 3.9 g/dL (ref 3.5–5.2)
ALK PHOS: 62 U/L (ref 39–117)
ALT: 25 U/L (ref 0–35)
AST: 20 U/L (ref 0–37)
BILIRUBIN TOTAL: 0.9 mg/dL (ref 0.3–1.2)
Bilirubin, Direct: 0.1 mg/dL (ref 0.0–0.3)
Total Protein: 6.9 g/dL (ref 6.0–8.3)

## 2013-11-04 LAB — HEMOGLOBIN A1C: Hgb A1c MFr Bld: 6.7 % — ABNORMAL HIGH (ref 4.6–6.5)

## 2013-11-04 LAB — LDL CHOLESTEROL, DIRECT: Direct LDL: 116.9 mg/dL

## 2013-11-05 ENCOUNTER — Other Ambulatory Visit: Payer: Medicare Other

## 2013-11-05 ENCOUNTER — Other Ambulatory Visit: Payer: Self-pay | Admitting: Internal Medicine

## 2013-11-05 DIAGNOSIS — E119 Type 2 diabetes mellitus without complications: Secondary | ICD-10-CM

## 2013-11-20 ENCOUNTER — Other Ambulatory Visit: Payer: Self-pay | Admitting: *Deleted

## 2013-11-20 ENCOUNTER — Other Ambulatory Visit (INDEPENDENT_AMBULATORY_CARE_PROVIDER_SITE_OTHER): Payer: Medicare Other

## 2013-11-20 DIAGNOSIS — Z139 Encounter for screening, unspecified: Secondary | ICD-10-CM

## 2013-11-20 LAB — FECAL OCCULT BLOOD, IMMUNOCHEMICAL: FECAL OCCULT BLD: POSITIVE — AB

## 2013-11-21 ENCOUNTER — Other Ambulatory Visit: Payer: Self-pay | Admitting: Internal Medicine

## 2013-11-21 DIAGNOSIS — K625 Hemorrhage of anus and rectum: Secondary | ICD-10-CM

## 2013-11-21 DIAGNOSIS — R195 Other fecal abnormalities: Secondary | ICD-10-CM

## 2013-11-21 NOTE — Progress Notes (Signed)
Order placed for GI referral.   

## 2013-11-25 DIAGNOSIS — R195 Other fecal abnormalities: Secondary | ICD-10-CM | POA: Diagnosis not present

## 2013-11-25 DIAGNOSIS — K625 Hemorrhage of anus and rectum: Secondary | ICD-10-CM | POA: Diagnosis not present

## 2013-12-03 ENCOUNTER — Other Ambulatory Visit: Payer: Self-pay | Admitting: Internal Medicine

## 2013-12-16 ENCOUNTER — Ambulatory Visit: Payer: Self-pay | Admitting: Gastroenterology

## 2013-12-16 DIAGNOSIS — K625 Hemorrhage of anus and rectum: Secondary | ICD-10-CM | POA: Diagnosis not present

## 2013-12-16 DIAGNOSIS — Z888 Allergy status to other drugs, medicaments and biological substances status: Secondary | ICD-10-CM | POA: Diagnosis not present

## 2013-12-16 DIAGNOSIS — I10 Essential (primary) hypertension: Secondary | ICD-10-CM | POA: Diagnosis not present

## 2013-12-16 DIAGNOSIS — K5730 Diverticulosis of large intestine without perforation or abscess without bleeding: Secondary | ICD-10-CM | POA: Diagnosis not present

## 2013-12-16 DIAGNOSIS — Z885 Allergy status to narcotic agent status: Secondary | ICD-10-CM | POA: Diagnosis not present

## 2013-12-16 DIAGNOSIS — Q438 Other specified congenital malformations of intestine: Secondary | ICD-10-CM | POA: Diagnosis not present

## 2013-12-16 DIAGNOSIS — D649 Anemia, unspecified: Secondary | ICD-10-CM | POA: Diagnosis not present

## 2013-12-16 DIAGNOSIS — D129 Benign neoplasm of anus and anal canal: Secondary | ICD-10-CM | POA: Diagnosis not present

## 2013-12-16 DIAGNOSIS — K6289 Other specified diseases of anus and rectum: Secondary | ICD-10-CM | POA: Diagnosis not present

## 2013-12-16 DIAGNOSIS — D128 Benign neoplasm of rectum: Secondary | ICD-10-CM | POA: Diagnosis not present

## 2013-12-16 DIAGNOSIS — K621 Rectal polyp: Secondary | ICD-10-CM | POA: Diagnosis not present

## 2013-12-16 DIAGNOSIS — M810 Age-related osteoporosis without current pathological fracture: Secondary | ICD-10-CM | POA: Diagnosis not present

## 2013-12-16 DIAGNOSIS — E7800 Pure hypercholesterolemia, unspecified: Secondary | ICD-10-CM | POA: Diagnosis not present

## 2013-12-16 DIAGNOSIS — K620 Anal polyp: Secondary | ICD-10-CM | POA: Diagnosis not present

## 2013-12-16 DIAGNOSIS — Z79899 Other long term (current) drug therapy: Secondary | ICD-10-CM | POA: Diagnosis not present

## 2013-12-16 DIAGNOSIS — R195 Other fecal abnormalities: Secondary | ICD-10-CM | POA: Diagnosis not present

## 2013-12-16 DIAGNOSIS — Z800 Family history of malignant neoplasm of digestive organs: Secondary | ICD-10-CM | POA: Diagnosis not present

## 2013-12-16 DIAGNOSIS — D126 Benign neoplasm of colon, unspecified: Secondary | ICD-10-CM | POA: Diagnosis not present

## 2013-12-16 LAB — HM COLONOSCOPY

## 2013-12-19 LAB — PATHOLOGY REPORT

## 2013-12-24 ENCOUNTER — Encounter: Payer: Self-pay | Admitting: Internal Medicine

## 2013-12-24 ENCOUNTER — Ambulatory Visit: Payer: Self-pay | Admitting: Internal Medicine

## 2013-12-24 DIAGNOSIS — Z7189 Other specified counseling: Secondary | ICD-10-CM | POA: Diagnosis not present

## 2013-12-24 DIAGNOSIS — E119 Type 2 diabetes mellitus without complications: Secondary | ICD-10-CM | POA: Diagnosis not present

## 2013-12-25 DIAGNOSIS — Z86010 Personal history of colonic polyps: Secondary | ICD-10-CM | POA: Insufficient documentation

## 2013-12-30 DIAGNOSIS — R195 Other fecal abnormalities: Secondary | ICD-10-CM | POA: Diagnosis not present

## 2013-12-30 DIAGNOSIS — K625 Hemorrhage of anus and rectum: Secondary | ICD-10-CM | POA: Diagnosis not present

## 2014-01-09 ENCOUNTER — Ambulatory Visit: Payer: Self-pay | Admitting: Internal Medicine

## 2014-01-09 DIAGNOSIS — E119 Type 2 diabetes mellitus without complications: Secondary | ICD-10-CM | POA: Diagnosis not present

## 2014-01-09 DIAGNOSIS — Z7189 Other specified counseling: Secondary | ICD-10-CM | POA: Diagnosis not present

## 2014-02-03 ENCOUNTER — Encounter: Payer: Self-pay | Admitting: Internal Medicine

## 2014-02-03 ENCOUNTER — Ambulatory Visit (INDEPENDENT_AMBULATORY_CARE_PROVIDER_SITE_OTHER): Payer: Medicare Other | Admitting: Internal Medicine

## 2014-02-03 VITALS — BP 130/70 | HR 79 | Temp 97.9°F | Ht 64.0 in | Wt 162.2 lb

## 2014-02-03 DIAGNOSIS — E7800 Pure hypercholesterolemia, unspecified: Secondary | ICD-10-CM | POA: Diagnosis not present

## 2014-02-03 DIAGNOSIS — E119 Type 2 diabetes mellitus without complications: Secondary | ICD-10-CM

## 2014-02-03 DIAGNOSIS — D649 Anemia, unspecified: Secondary | ICD-10-CM

## 2014-02-03 DIAGNOSIS — I10 Essential (primary) hypertension: Secondary | ICD-10-CM

## 2014-02-03 DIAGNOSIS — M810 Age-related osteoporosis without current pathological fracture: Secondary | ICD-10-CM | POA: Diagnosis not present

## 2014-02-03 DIAGNOSIS — Z860100 Personal history of colon polyps, unspecified: Secondary | ICD-10-CM

## 2014-02-03 DIAGNOSIS — H409 Unspecified glaucoma: Secondary | ICD-10-CM

## 2014-02-03 NOTE — Progress Notes (Signed)
Subjective:    Patient ID: Tina Watson, female    DOB: [birth year], 80 y.o.   MRN: [id number]  HPI 80 year old female with past history of hypertension, hyperglycemia and hypercholesterolemia who comes in today for a scheduled follow up.   She states she has been doing relatively well.  No chest pain or tightness.  Breathing stable.  Bowels doing well.  Urinating ok.   Eating and drinking well.  She has been to [facility].  Has adjusted her diet. States blood sugars in the am are averaging 107-115 and pm sugars averaging 124-130.  Eating and drinking well.  No nausea or vomiting.  No significant abdominal pain or cramping.  Bowels stable.       Past Medical History  Diagnosis Date  . Hypertension   . Hypercholesterolemia   . Granuloma annulare   . Osteoporosis   . Diverticulosis   . Anemia   . Diabetes mellitus without complication   . Cancer     Skin  . Colon polyps   . Chicken pox     Current Outpatient Prescriptions on File Prior to Visit  Medication Sig Dispense Refill  . Calcium Carbonate-Vitamin D (CALCIUM 600+D) 600-400 MG-UNIT per tablet Take 1 tablet by mouth daily.      . ferrous sulfate 325 (65 FE) MG tablet TAKE 1 TABLET (325 MG TOTAL) BY MOUTH DAILY.  30 tablet  5  . fish oil-omega-3 fatty acids 1000 MG capsule Take 1 g by mouth daily.      Marland Kitchen glucose blood (BAYER CONTOUR NEXT TEST) test strip 1 each by Other route as needed. Use as instructed      . Multiple Vitamin (MULTIVITAMIN) tablet Take 1 tablet by mouth daily.      . simvastatin (ZOCOR) 40 MG tablet TAKE 1 TABLET (40 MG TOTAL) BY MOUTH EVERY EVENING.  30 tablet  5  . telmisartan (MICARDIS) 40 MG tablet Take 1 tablet (40 mg total) by mouth daily.  30 tablet  5  . triamterene-hydrochlorothiazide (MAXZIDE-25) 37.5-25 MG per tablet TAKE 1/2 TABLET ONCE A DAY  30 tablet  2   No current facility-administered medications on file prior to visit.    Review of Systems Patient denies any headache, lightheadedness or  dizziness. No sinus or allergy symptoms.  No chest pain, tightness or palpitations.  No increased shortness of breath, cough or congestion.  No nausea or vomiting.  No acid reflux.  No significant abdominal pain or cramping.  No urine change.  Overall she feels she is doing well.  Eating and drinking well.  Has adjusted her diet.  Sugars as outlined.       Objective:   Physical Exam  Filed Vitals:   02/03/14 0956  BP: 130/70  Pulse: 79  Temp: 97.9 F (14.11 C)   80 year old female in no acute distress.   HEENT:  Nares- clear.  Oropharynx - without lesions. NECK:  Supple.  Nontender.  No audible bruit.  HEART:  Appears to be regular. LUNGS:  No crackles or wheezing audible.  Respirations even and unlabored.  RADIAL PULSE:  Equal bilaterally.     ABDOMEN:  Soft.  No significant tenderness to palpation.  Bowel sounds present and normal.  No audible abdominal bruit.  EXTREMITIES:  No increased edema present.  DP pulses palpable and equal bilaterally.  FEET:  No lesions.             Assessment & Plan:  GI.  Abdominal ultrasound 10/11  revealed a liver mass that was felt likely to be a harmatoma vs liver cyst.  Too small to characterize.  We have discussed further w/up including MRI, etc - to further evaluate.  She has declined.  Had follow up ultrasound 4/12.  After speaking with radiology, they compared ultrasounds and felt the findings were c/w benign cysts.  Follow up ultrasound 4/13 - cysts (relatively stable).  Follow.  Colonoscopy 09/17/06 revealed diverticulosis with multiple nonbleeding colonic angioectasias.  Saw GI.  Had colonoscopy 4/15 with results as outlined.  Currently doing well and feels things are stable.       CARDIOVASCULAR.  Stress test 10/06/10 negative.  ECHO 10/06/10 revealed EF 55%, mild mitral insufficiency and mild LVH. Currently asymptomatic.    HISTORY OF ABDOMINAL BRUIT.  Renal ultrasound and abdominal ultrasound revealed echodensity in the left kidney suspicious for a  stone.  She has declined further w/up.  Ultrasounds otherwise negative.    HEALTH MAINTENANCE.  Physical 03/24/13.    She is s/p hysterectomy.  Colonoscopy as outlined.  Bone density 07/28/08 normal.  Mammogram 05/09/13 - Birads I.   I spent 25 minutes with the patient and more than 50% of the time was spent in consultation regarding the above.

## 2014-02-03 NOTE — Progress Notes (Signed)
Pre visit review using our clinic review tool, if applicable. No additional management support is needed unless otherwise documented below in the visit note. 

## 2014-02-07 ENCOUNTER — Encounter: Payer: Self-pay | Admitting: Internal Medicine

## 2014-02-07 NOTE — Assessment & Plan Note (Signed)
Just had colonoscopy.  Has had no further bleeding.  Follow.

## 2014-02-07 NOTE — Assessment & Plan Note (Signed)
Borderline glaucoma.  Followed by Dr Brennan.    

## 2014-02-07 NOTE — Assessment & Plan Note (Signed)
Bone density 07/28/08 normal.  Follow.   

## 2014-02-07 NOTE — Assessment & Plan Note (Signed)
Blood pressure under control.  Same med regimen.  Follow.  Follow metabolic panel.

## 2014-02-07 NOTE — Assessment & Plan Note (Signed)
Low cholesterol diet and exercise.  Continues on simvastatin.  Have discussed changing meds (especially since she is on 40mg ) - she has declined.  Check lipid panel and liver function.

## 2014-02-07 NOTE — Assessment & Plan Note (Signed)
Low carb diet and exercise.  Follow metabolic panel and a1c.  She went to [facility].  Has adjusted her diet.  Sugars as outlined.  Up to date with eye exams.    

## 2014-02-07 NOTE — Assessment & Plan Note (Signed)
Dr Vladimir Creeks released her.  Follow cbc.

## 2014-02-09 ENCOUNTER — Ambulatory Visit: Payer: Self-pay | Admitting: Internal Medicine

## 2014-02-25 DIAGNOSIS — H40009 Preglaucoma, unspecified, unspecified eye: Secondary | ICD-10-CM | POA: Diagnosis not present

## 2014-04-07 ENCOUNTER — Other Ambulatory Visit (INDEPENDENT_AMBULATORY_CARE_PROVIDER_SITE_OTHER): Payer: Medicare Other

## 2014-04-07 DIAGNOSIS — I10 Essential (primary) hypertension: Secondary | ICD-10-CM | POA: Diagnosis not present

## 2014-04-07 DIAGNOSIS — E119 Type 2 diabetes mellitus without complications: Secondary | ICD-10-CM | POA: Diagnosis not present

## 2014-04-07 DIAGNOSIS — E7800 Pure hypercholesterolemia, unspecified: Secondary | ICD-10-CM | POA: Diagnosis not present

## 2014-04-07 LAB — HEPATIC FUNCTION PANEL
ALK PHOS: 60 U/L (ref 39–117)
ALT: 25 U/L (ref 0–35)
AST: 21 U/L (ref 0–37)
Albumin: 3.9 g/dL (ref 3.5–5.2)
BILIRUBIN DIRECT: 0 mg/dL (ref 0.0–0.3)
Total Bilirubin: 0.9 mg/dL (ref 0.2–1.2)
Total Protein: 6.9 g/dL (ref 6.0–8.3)

## 2014-04-07 LAB — LIPID PANEL
CHOLESTEROL: 206 mg/dL — AB (ref 0–200)
HDL: 61.9 mg/dL (ref >39.00)
NonHDL: 144.1
Total CHOL/HDL Ratio: 3
Triglycerides: 336 mg/dL — ABNORMAL HIGH (ref 0.0–149.0)
VLDL: 67.2 mg/dL — ABNORMAL HIGH (ref 0.0–40.0)

## 2014-04-07 LAB — BASIC METABOLIC PANEL
BUN: 25 mg/dL — AB (ref 6–23)
CO2: 29 mEq/L (ref 19–32)
CREATININE: 1.1 mg/dL (ref 0.4–1.2)
Calcium: 9.3 mg/dL (ref 8.4–10.5)
Chloride: 103 mEq/L (ref 96–112)
GFR: 53 mL/min — AB (ref >60.00)
Glucose, Bld: 114 mg/dL — ABNORMAL HIGH (ref 70–99)
Potassium: 3.8 mEq/L (ref 3.5–5.1)
Sodium: 138 mEq/L (ref 135–145)

## 2014-04-07 LAB — LDL CHOLESTEROL, DIRECT: Direct LDL: 103.3 mg/dL

## 2014-04-07 LAB — HEMOGLOBIN A1C: HEMOGLOBIN A1C: 6.5 % (ref 4.6–6.5)

## 2014-04-08 ENCOUNTER — Encounter: Payer: Self-pay | Admitting: *Deleted

## 2014-04-09 ENCOUNTER — Other Ambulatory Visit: Payer: Self-pay | Admitting: *Deleted

## 2014-04-09 MED ORDER — GLUCOSE BLOOD VI STRP: ORAL_STRIP | Status: DC

## 2014-04-13 ENCOUNTER — Encounter: Payer: Self-pay | Admitting: Internal Medicine

## 2014-04-13 DIAGNOSIS — Z86010 Personal history of colonic polyps: Secondary | ICD-10-CM

## 2014-04-28 ENCOUNTER — Other Ambulatory Visit: Payer: Self-pay | Admitting: Internal Medicine

## 2014-05-05 ENCOUNTER — Other Ambulatory Visit: Payer: Self-pay | Admitting: Internal Medicine

## 2014-06-03 ENCOUNTER — Other Ambulatory Visit: Payer: Self-pay | Admitting: Internal Medicine

## 2014-06-08 ENCOUNTER — Ambulatory Visit (INDEPENDENT_AMBULATORY_CARE_PROVIDER_SITE_OTHER): Payer: Medicare Other | Admitting: Internal Medicine

## 2014-06-08 ENCOUNTER — Encounter: Payer: Self-pay | Admitting: Internal Medicine

## 2014-06-08 VITALS — BP 122/70 | HR 79 | Temp 97.8°F | Ht 64.0 in | Wt 162.8 lb

## 2014-06-08 DIAGNOSIS — Z1239 Encounter for other screening for malignant neoplasm of breast: Secondary | ICD-10-CM | POA: Diagnosis not present

## 2014-06-08 DIAGNOSIS — E7800 Pure hypercholesterolemia, unspecified: Secondary | ICD-10-CM

## 2014-06-08 DIAGNOSIS — D649 Anemia, unspecified: Secondary | ICD-10-CM | POA: Diagnosis not present

## 2014-06-08 DIAGNOSIS — E119 Type 2 diabetes mellitus without complications: Secondary | ICD-10-CM

## 2014-06-08 DIAGNOSIS — N8110 Cystocele, unspecified: Secondary | ICD-10-CM

## 2014-06-08 DIAGNOSIS — M810 Age-related osteoporosis without current pathological fracture: Secondary | ICD-10-CM

## 2014-06-08 DIAGNOSIS — Z86010 Personal history of colonic polyps: Secondary | ICD-10-CM

## 2014-06-08 DIAGNOSIS — Z23 Encounter for immunization: Secondary | ICD-10-CM | POA: Diagnosis not present

## 2014-06-08 DIAGNOSIS — H409 Unspecified glaucoma: Secondary | ICD-10-CM

## 2014-06-08 DIAGNOSIS — I10 Essential (primary) hypertension: Secondary | ICD-10-CM

## 2014-06-08 LAB — HM DIABETES FOOT EXAM

## 2014-06-08 NOTE — Progress Notes (Signed)
Subjective:    Patient ID: Tina Watson, female    DOB: [birth year], 80 y.o.   MRN: [id number]  HPI 80 year old female with past history of hypertension, hyperglycemia and hypercholesterolemia who comes in today to follow up on these issues as well as for a complete physical exam.  She states she has been doing relatively well.  No chest pain or tightness.  Breathing stable.  Bowels doing well.  Urinating ok.   Eating and drinking well.  She has been to [facility].  Has adjusted her diet some.  Trying to watch what she eats.  Plans to exercise more.  States blood sugars in the am are averaging 90-120.  Eating and drinking well.  No nausea or vomiting.  No significant abdominal pain or cramping.  Bowels stable.   Vaginal prolapse.  No pain.  No urinary issues.       Past Medical History  Diagnosis Date  . Hypertension   . Hypercholesterolemia   . Granuloma annulare   . Osteoporosis   . Diverticulosis   . Anemia   . Diabetes mellitus without complication   . Cancer     Skin  . Colon polyps   . Chicken pox     Current Outpatient Prescriptions on File Prior to Visit  Medication Sig Dispense Refill  . Calcium Carbonate-Vitamin D (CALCIUM 600+D) 600-400 MG-UNIT per tablet Take 1 tablet by mouth daily.      . ferrous sulfate 325 (65 FE) MG tablet TAKE 1 TABLET BY MOUTH DAILY  30 tablet  5  . fish oil-omega-3 fatty acids 1000 MG capsule Take 1 g by mouth daily.      Marland Kitchen glucose blood (BAYER CONTOUR NEXT TEST) test strip TEST ONCE DAILY  100 each  2  . latanoprost (XALATAN) 0.005 % ophthalmic solution Place 1 drop into both eyes at bedtime.      . Multiple Vitamin (MULTIVITAMIN) tablet Take 1 tablet by mouth daily.      . simvastatin (ZOCOR) 40 MG tablet TAKE 1 TABLET (40 MG TOTAL) BY MOUTH EVERY EVENING.  30 tablet  5  . telmisartan (MICARDIS) 40 MG tablet TAKE 1 TABLET BY MOUTH DAILY  30 tablet  5  . triamterene-hydrochlorothiazide (MAXZIDE-25) 37.5-25 MG per tablet TAKE 1/2 TABLET ONCE A DAY   30 tablet  5   No current facility-administered medications on file prior to visit.    Review of Systems Patient denies any headache, lightheadedness or dizziness. No sinus or allergy symptoms.  No chest pain, tightness or palpitations.  No increased shortness of breath, cough or congestion.  No nausea or vomiting.  No acid reflux.  No significant abdominal pain or cramping.  No urine change.  Overall she feels she is doing well.  Eating and drinking well.  Has adjusted her diet.  Sugars averaging 90-120 in the am.  Highest reading 137 (only on one occasion).  She is having some issues with vaginal prolapse.  Desires no further intervention at this point.  Does report some fatigue, but overall feels she is doing well.  Has been working a lot lately (volunteering).       Objective:   Physical Exam  Filed Vitals:   06/08/14 0837  BP: 122/70  Pulse: 79  Temp: 97.8 F (36.6 C)   Blood pressure recheck:  42-62/73  80 year old female in no acute distress.   HEENT:  Nares- clear.  Oropharynx - without lesions. NECK:  Supple.  Nontender.  No  audible bruit.  HEART:  Appears to be regular. LUNGS:  No crackles or wheezing audible.  Respirations even and unlabored.  RADIAL PULSE:  Equal bilaterally.    BREASTS:  No nipple discharge or nipple retraction present.  Could not appreciate any distinct nodules or axillary adenopathy.  ABDOMEN:  Soft, nontender.  Bowel sounds present and normal.  No audible abdominal bruit.  GU:  Not performed.     EXTREMITIES:  No increased edema present.  DP pulses palpable and equal bilaterally.      FEET:  No lesions.        Assessment & Plan:  GI.  Abdominal ultrasound 10/11 revealed a liver mass that was felt likely to be a harmatoma vs liver cyst.  Too small to characterize.  We have discussed further w/up including MRI, etc - to further evaluate.  She has declined.  Had follow up ultrasound 4/12.  After speaking with radiology, they compared ultrasounds and  felt the findings were c/w benign cysts.  Follow up ultrasound 4/13 - cysts (relatively stable).  Follow.  Colonoscopy 09/17/06 revealed diverticulosis with multiple nonbleeding colonic angioectasias.  Saw GI.  Had colonoscopy 4/15 with results as outlined.  Currently doing well and feels things are stable.       CARDIOVASCULAR.  Stress test 10/06/10 negative.  ECHO 10/06/10 revealed EF 55%, mild mitral insufficiency and mild LVH. Currently asymptomatic.    HISTORY OF ABDOMINAL BRUIT.  Renal ultrasound and abdominal ultrasound revealed echodensity in the left kidney suspicious for a stone.  She has declined further w/up.  Ultrasounds otherwise negative.    HEALTH MAINTENANCE.  Physical today.    She is s/p hysterectomy.  Colonoscopy as outlined.  Bone density 07/28/08 normal.  Mammogram 05/09/13 - Birads I.  Schedule f/u mammogram today.  I spent 25 minutes with the patient and more than 50% of the time was spent in consultation regarding the above.

## 2014-06-08 NOTE — Progress Notes (Signed)
Pre visit review using our clinic review tool, if applicable. No additional management support is needed unless otherwise documented below in the visit note. 

## 2014-06-09 ENCOUNTER — Encounter: Payer: Self-pay | Admitting: Internal Medicine

## 2014-06-09 DIAGNOSIS — N8110 Cystocele, unspecified: Secondary | ICD-10-CM | POA: Insufficient documentation

## 2014-06-09 NOTE — Assessment & Plan Note (Signed)
Bone density 07/28/08 normal.  Follow.

## 2014-06-09 NOTE — Assessment & Plan Note (Signed)
Blood pressure under reasonable control.  Same med regimen.  Follow.  Follow metabolic panel.

## 2014-06-09 NOTE — Assessment & Plan Note (Signed)
Low cholesterol diet and exercise.  Continues on simvastatin.  Have discussed changing meds (especially since she is on 40mg ) - she has declined.  Check lipid panel and liver function with next fasting labs.

## 2014-06-09 NOTE — Assessment & Plan Note (Signed)
Just had colonoscopy 12/16/13.  Has had no further bleeding.  Polyps removed (hyperplastic).  Recommended f/u colonoscopy in 5 years.

## 2014-06-09 NOTE — Assessment & Plan Note (Signed)
Low carb diet and exercise.  Follow metabolic panel and N3I.  She went to [facility].  Has adjusted her diet.  Sugars as outlined.  Up to date with eye exams.

## 2014-06-09 NOTE — Assessment & Plan Note (Signed)
Desires no further intervention at this point.  We discussed the possibility of pessary.  Follow.

## 2014-06-09 NOTE — Assessment & Plan Note (Signed)
Borderline glaucoma.  Followed by Dr Tobe Sos.

## 2014-06-09 NOTE — Assessment & Plan Note (Signed)
Dr Vladimir Creeks released her.  Follow cbc.  Hgb 11/04/13 - 12.5.

## 2014-07-01 DIAGNOSIS — Z808 Family history of malignant neoplasm of other organs or systems: Secondary | ICD-10-CM | POA: Diagnosis not present

## 2014-07-01 DIAGNOSIS — L2084 Intrinsic (allergic) eczema: Secondary | ICD-10-CM | POA: Diagnosis not present

## 2014-07-01 DIAGNOSIS — Z1283 Encounter for screening for malignant neoplasm of skin: Secondary | ICD-10-CM | POA: Diagnosis not present

## 2014-07-01 DIAGNOSIS — L920 Granuloma annulare: Secondary | ICD-10-CM | POA: Diagnosis not present

## 2014-07-01 DIAGNOSIS — C44311 Basal cell carcinoma of skin of nose: Secondary | ICD-10-CM | POA: Diagnosis not present

## 2014-07-01 DIAGNOSIS — D485 Neoplasm of uncertain behavior of skin: Secondary | ICD-10-CM | POA: Diagnosis not present

## 2014-07-01 DIAGNOSIS — Z85828 Personal history of other malignant neoplasm of skin: Secondary | ICD-10-CM | POA: Diagnosis not present

## 2014-07-02 ENCOUNTER — Ambulatory Visit: Payer: Self-pay | Admitting: Internal Medicine

## 2014-07-02 DIAGNOSIS — Z1231 Encounter for screening mammogram for malignant neoplasm of breast: Secondary | ICD-10-CM | POA: Diagnosis not present

## 2014-07-02 LAB — HM MAMMOGRAPHY: HM MAMMO: NEGATIVE

## 2014-07-03 ENCOUNTER — Encounter: Payer: Self-pay | Admitting: *Deleted

## 2014-07-12 HISTORY — PX: MOHS SURGERY: SHX181

## 2014-07-20 DIAGNOSIS — Z85828 Personal history of other malignant neoplasm of skin: Secondary | ICD-10-CM | POA: Diagnosis not present

## 2014-07-20 DIAGNOSIS — C4490 Unspecified malignant neoplasm of skin, unspecified: Secondary | ICD-10-CM | POA: Diagnosis not present

## 2014-07-20 DIAGNOSIS — M952 Other acquired deformity of head: Secondary | ICD-10-CM | POA: Diagnosis not present

## 2014-07-20 DIAGNOSIS — Z9889 Other specified postprocedural states: Secondary | ICD-10-CM | POA: Diagnosis not present

## 2014-07-20 DIAGNOSIS — C44311 Basal cell carcinoma of skin of nose: Secondary | ICD-10-CM | POA: Diagnosis not present

## 2014-07-28 ENCOUNTER — Encounter: Payer: Self-pay | Admitting: Internal Medicine

## 2014-08-07 ENCOUNTER — Other Ambulatory Visit: Payer: Self-pay | Admitting: Internal Medicine

## 2014-08-10 ENCOUNTER — Other Ambulatory Visit: Payer: Self-pay | Admitting: *Deleted

## 2014-08-10 DIAGNOSIS — Z85828 Personal history of other malignant neoplasm of skin: Secondary | ICD-10-CM | POA: Diagnosis not present

## 2014-08-10 DIAGNOSIS — Z9889 Other specified postprocedural states: Secondary | ICD-10-CM | POA: Diagnosis not present

## 2014-08-10 MED ORDER — SIMVASTATIN 40 MG PO TABS: 40.0000 mg | ORAL_TABLET | Freq: Every day | ORAL | Status: DC

## 2014-08-10 MED ORDER — TELMISARTAN 40 MG PO TABS: 40.0000 mg | ORAL_TABLET | Freq: Every day | ORAL | Status: DC

## 2014-08-10 NOTE — Telephone Encounter (Signed)
Fax from pharmacy, requesting 90 day supply. Rx sent to pharmacy by escript  

## 2014-09-01 DIAGNOSIS — Z9889 Other specified postprocedural states: Secondary | ICD-10-CM | POA: Diagnosis not present

## 2014-09-15 ENCOUNTER — Other Ambulatory Visit (INDEPENDENT_AMBULATORY_CARE_PROVIDER_SITE_OTHER): Payer: Medicare Other

## 2014-09-15 DIAGNOSIS — E119 Type 2 diabetes mellitus without complications: Secondary | ICD-10-CM | POA: Diagnosis not present

## 2014-09-15 DIAGNOSIS — E7800 Pure hypercholesterolemia, unspecified: Secondary | ICD-10-CM

## 2014-09-15 LAB — LIPID PANEL
CHOL/HDL RATIO: 4
Cholesterol: 191 mg/dL (ref 0–200)
HDL: 49.3 mg/dL (ref >39.00)
NonHDL: 141.7
TRIGLYCERIDES: 232 mg/dL — AB (ref 0.0–149.0)
VLDL: 46.4 mg/dL — ABNORMAL HIGH (ref 0.0–40.0)

## 2014-09-15 LAB — BASIC METABOLIC PANEL
BUN: 30 mg/dL — ABNORMAL HIGH (ref 6–23)
CO2: 29 mEq/L (ref 19–32)
Calcium: 9.1 mg/dL (ref 8.4–10.5)
Chloride: 102 mEq/L (ref 96–112)
Creatinine, Ser: 1.2 mg/dL (ref 0.4–1.2)
GFR: 44.52 mL/min — ABNORMAL LOW (ref >60.00)
GLUCOSE: 113 mg/dL — AB (ref 70–99)
POTASSIUM: 4.3 meq/L (ref 3.5–5.1)
Sodium: 139 mEq/L (ref 135–145)

## 2014-09-15 LAB — MICROALBUMIN / CREATININE URINE RATIO
CREATININE, U: 125.5 mg/dL
Microalb Creat Ratio: 1 mg/g (ref 0.0–30.0)
Microalb, Ur: 1.3 mg/dL (ref 0.0–1.9)

## 2014-09-15 LAB — HEPATIC FUNCTION PANEL
ALBUMIN: 4 g/dL (ref 3.5–5.2)
ALK PHOS: 89 U/L (ref 39–117)
ALT: 27 U/L (ref 0–35)
AST: 23 U/L (ref 0–37)
Bilirubin, Direct: 0.1 mg/dL (ref 0.0–0.3)
TOTAL PROTEIN: 7.1 g/dL (ref 6.0–8.3)
Total Bilirubin: 0.7 mg/dL (ref 0.2–1.2)

## 2014-09-15 LAB — TSH: TSH: 3.03 u[IU]/mL (ref 0.35–4.50)

## 2014-09-15 LAB — HEMOGLOBIN A1C: Hgb A1c MFr Bld: 6.8 % — ABNORMAL HIGH (ref 4.6–6.5)

## 2014-09-15 LAB — LDL CHOLESTEROL, DIRECT: Direct LDL: 102.3 mg/dL

## 2014-09-28 ENCOUNTER — Encounter: Payer: Self-pay | Admitting: Internal Medicine

## 2014-09-28 ENCOUNTER — Ambulatory Visit (INDEPENDENT_AMBULATORY_CARE_PROVIDER_SITE_OTHER): Payer: Medicare Other | Admitting: Internal Medicine

## 2014-09-28 ENCOUNTER — Other Ambulatory Visit: Payer: Medicare Other

## 2014-09-28 VITALS — BP 131/74 | HR 82 | Temp 97.6°F | Ht 60.4 in | Wt 162.0 lb

## 2014-09-28 DIAGNOSIS — E669 Obesity, unspecified: Secondary | ICD-10-CM | POA: Diagnosis not present

## 2014-09-28 DIAGNOSIS — E7800 Pure hypercholesterolemia, unspecified: Secondary | ICD-10-CM

## 2014-09-28 DIAGNOSIS — E119 Type 2 diabetes mellitus without complications: Secondary | ICD-10-CM | POA: Diagnosis not present

## 2014-09-28 DIAGNOSIS — N289 Disorder of kidney and ureter, unspecified: Secondary | ICD-10-CM

## 2014-09-28 DIAGNOSIS — R0981 Nasal congestion: Secondary | ICD-10-CM | POA: Diagnosis not present

## 2014-09-28 DIAGNOSIS — R21 Rash and other nonspecific skin eruption: Secondary | ICD-10-CM | POA: Diagnosis not present

## 2014-09-28 DIAGNOSIS — Z86010 Personal history of colonic polyps: Secondary | ICD-10-CM

## 2014-09-28 DIAGNOSIS — I10 Essential (primary) hypertension: Secondary | ICD-10-CM

## 2014-09-28 DIAGNOSIS — D649 Anemia, unspecified: Secondary | ICD-10-CM

## 2014-09-28 LAB — BASIC METABOLIC PANEL
BUN: 27 mg/dL — AB (ref 6–23)
CALCIUM: 9.5 mg/dL (ref 8.4–10.5)
CO2: 30 meq/L (ref 19–32)
Chloride: 99 mEq/L (ref 96–112)
Creatinine, Ser: 1.07 mg/dL (ref 0.40–1.20)
GFR: 51.79 mL/min — ABNORMAL LOW (ref >60.00)
GLUCOSE: 105 mg/dL — AB (ref 70–99)
Potassium: 4.1 mEq/L (ref 3.5–5.1)
SODIUM: 137 meq/L (ref 135–145)

## 2014-09-28 MED ORDER — TRIAMCINOLONE ACETONIDE 0.1 % EX CREA: 1.0000 "application " | TOPICAL_CREAM | Freq: Two times a day (BID) | CUTANEOUS | Status: DC

## 2014-09-28 NOTE — Progress Notes (Signed)
Pre visit review using our clinic review tool, if applicable. No additional management support is needed unless otherwise documented below in the visit note. 

## 2014-09-28 NOTE — Progress Notes (Signed)
Subjective:    Patient ID: Tina Watson, female    DOB: Jun 28, 1928, 81 y.o.   MRN: [id number]  HPI 81 year old female with past history of hypertension, hyperglycemia and hypercholesterolemia who comes in today for a scheduled follow up  She states she has been doing relatively well.  No chest pain or tightness.  Had recent nasal surgery.  Seeing Dr Carlis Abbott.  Due to f/u tomorrow.  Has had some congestion since before Christmas.  Increased nasal and sinus congestion.  mucus production - colored today.  Some cough.  No wheezing.  thick mucus production occasionally.  Previous sore throat.  This has resolved.  No fever.  Some fatigue.  Breathing stable.  Bowels doing well.  Eating and drinking well.  She has been to [facility].  Has adjusted her diet some.  Trying to watch what she eats.  Plans to exercise more.  Brought in no recorded sugar readings.  No nausea or vomiting.  No abdominal pain or cramping.  No urinary issues reported.  Triglycerides improved.  A1c 6.8.  Discussed diet and exercise.       Past Medical History  Diagnosis Date  . Hypertension   . Hypercholesterolemia   . Granuloma annulare   . Osteoporosis   . Diverticulosis   . Anemia   . Diabetes mellitus without complication   . Cancer     Skin  . Colon polyps   . Chicken pox     Current Outpatient Prescriptions on File Prior to Visit  Medication Sig Dispense Refill  . Calcium Carbonate-Vitamin D (CALCIUM 600+D) 600-400 MG-UNIT per tablet Take 1 tablet by mouth daily.    . ferrous sulfate 325 (65 FE) MG tablet TAKE 1 TABLET BY MOUTH DAILY 30 tablet 5  . glucose blood (BAYER CONTOUR NEXT TEST) test strip TEST ONCE DAILY 100 each 2  . latanoprost (XALATAN) 0.005 % ophthalmic solution Place 1 drop into both eyes at bedtime.    . Multiple Vitamin (MULTIVITAMIN) tablet Take 1 tablet by mouth daily.    . simvastatin (ZOCOR) 40 MG tablet Take 1 tablet (40 mg total) by mouth daily. 90 tablet 1  . telmisartan (MICARDIS) 40 MG tablet  Take 1 tablet (40 mg total) by mouth daily. 90 tablet 1  . triamterene-hydrochlorothiazide (MAXZIDE-25) 37.5-25 MG per tablet TAKE 1/2 TABLET ONCE A DAY 30 tablet 5  . fish oil-omega-3 fatty acids 1000 MG capsule Take 1 g by mouth daily.     No current facility-administered medications on file prior to visit.    Review of Systems Patient denies any headache, lightheadedness or dizziness.  Recent nasal surgery as outlined.  Sinus congestion and nasal congestion as outlined.   No chest pain, tightness or palpitations. No increased shortness of breath.  Cough as outlined.  No wheezing.   No nausea or vomiting.  No acid reflux.  No abdominal pain or cramping.  No urine change.  Overall she feels she is doing relatively well.  Eating and drinking well.  Brought in no recorded sugar readings.  A1c just checked - 6.8.   Does report some fatigue, but overall feels she is doing well. Does report rash - anterior chest. Itches.  No new soaps, detergents, medications, etc.       Objective:   Physical Exam  Filed Vitals:   09/28/14 1104  BP: 131/74  Pulse: 82  Temp: 97.6 F (60.47 C)   81 year old female in no acute distress.   HEENT:  Nares- slightly erythematous turbinates.  Oropharynx - without lesions.  We healed incision site.  No significant tenderness to palpation over the sinuses.   NECK:  Supple.  Nontender.  No audible bruit.  HEART:  Appears to be regular. LUNGS:  No crackles or wheezing audible.  Respirations even and unlabored.  RADIAL PULSE:  Equal bilaterally. ABDOMEN:  Soft, nontender.  Bowel sounds present and normal.  No audible abdominal bruit.     EXTREMITIES:  No increased edema present.  DP pulses palpable and equal bilaterally.      FEET:  No lesions.        Assessment & Plan:  1. Renal insufficiency Cr 09/15/14 1.2.  Recheck today.  Stay hydrated.   - Basic metabolic panel  2. Essential hypertension Blood pressure as outlined above.  Same medication regimen.  Follow met  b.    3. Type 2 diabetes mellitus without complication Discussed low carb diet.  Exercise.  A1c just checked 6.8.  Follow.    4. Obesity (BMI 30-39.9) Diet and exercise.    5. Hypercholesterolemia Low cholesterol diet and exercise.  Triglycerides improved.  LDL stable - 102.  Lab Results  Component Value Date   CHOL 191 09/15/2014   HDL 49.30 09/15/2014   LDLDIRECT 102.3 09/15/2014   TRIG 232.0* 09/15/2014   CHOLHDL 4 09/15/2014   6. Anemia, unspecified anemia type Hgb wnl on last check.  Follow.    7. History of colonic polyps Colonoscopy 12/16/13 revealed 91m polyp in the transverse colon, two 1-262mpolyps in the rectum, diverticulosis and erythematous mucosa in the anus.  Pathology - nonspecific crypt hyperplasia and hyperplastic polyp.  Recommended f/u colonoscopy in five years.    8. Sinus congestion Has associated cough.  Mucinex and robitussin as directed.  Due to see Dr ClCarlis Abbottomorrow.   Will d/w him regarding nasal rinse.  Hold abx.  Follow.    9. Rash Unclear etiology.  Apply TCC .1% as directed.  Notify me if persistent.    10. GI.  Abdominal ultrasound 10/11 revealed a liver mass that was felt likely to be a harmatoma vs liver cyst.  Too small to characterize.  We have discussed further w/up including MRI, etc - to further evaluate.  She has declined.  Had follow up ultrasound 4/12.  After speaking with radiology, they compared ultrasounds and felt the findings were c/w benign cysts.  Follow up ultrasound 4/13 - cysts (relatively stable).  Follow.  Colonoscopy 09/17/06 revealed diverticulosis with multiple nonbleeding colonic angioectasias.  Saw GI.  Had colonoscopy 4/15 with results as outlined.  Currently doing well from a GI standpoint and feels things are stable.       11. CARDIOVASCULAR.  Stress test 10/06/10 negative.  ECHO 10/06/10 revealed EF 55%, mild mitral insufficiency and mild LVH. Currently asymptomatic.    12. HISTORY OF ABDOMINAL BRUIT.  Renal ultrasound and  abdominal ultrasound revealed echodensity in the left kidney suspicious for a stone.  She has declined further w/up.  Ultrasounds otherwise negative.    HEALTH MAINTENANCE.  Physical 06/08/14.    She is s/p hysterectomy.  Colonoscopy as outlined.  Bone density 07/28/08 normal.  Mammogram 07/02/14 - Birads I.   I spent 25 minutes with the patient and more than 50% of the time was spent in consultation regarding the above.

## 2014-09-29 ENCOUNTER — Encounter: Payer: Self-pay | Admitting: Internal Medicine

## 2014-09-29 ENCOUNTER — Encounter: Payer: Self-pay | Admitting: *Deleted

## 2014-09-29 DIAGNOSIS — R0981 Nasal congestion: Secondary | ICD-10-CM | POA: Insufficient documentation

## 2014-09-29 DIAGNOSIS — E669 Obesity, unspecified: Secondary | ICD-10-CM | POA: Insufficient documentation

## 2014-09-29 DIAGNOSIS — R21 Rash and other nonspecific skin eruption: Secondary | ICD-10-CM | POA: Insufficient documentation

## 2014-09-30 DIAGNOSIS — H40003 Preglaucoma, unspecified, bilateral: Secondary | ICD-10-CM | POA: Diagnosis not present

## 2014-10-07 ENCOUNTER — Ambulatory Visit: Payer: Medicare Other | Admitting: Internal Medicine

## 2014-10-08 ENCOUNTER — Ambulatory Visit: Payer: Medicare Other | Admitting: Internal Medicine

## 2014-12-18 DIAGNOSIS — Z9889 Other specified postprocedural states: Secondary | ICD-10-CM | POA: Diagnosis not present

## 2014-12-18 DIAGNOSIS — Z85828 Personal history of other malignant neoplasm of skin: Secondary | ICD-10-CM | POA: Diagnosis not present

## 2014-12-18 DIAGNOSIS — Z6827 Body mass index (BMI) 27.0-27.9, adult: Secondary | ICD-10-CM | POA: Diagnosis not present

## 2014-12-18 DIAGNOSIS — L989 Disorder of the skin and subcutaneous tissue, unspecified: Secondary | ICD-10-CM | POA: Diagnosis not present

## 2015-01-25 ENCOUNTER — Other Ambulatory Visit (INDEPENDENT_AMBULATORY_CARE_PROVIDER_SITE_OTHER): Payer: Medicare Other

## 2015-01-25 DIAGNOSIS — D649 Anemia, unspecified: Secondary | ICD-10-CM

## 2015-01-25 DIAGNOSIS — I10 Essential (primary) hypertension: Secondary | ICD-10-CM

## 2015-01-25 DIAGNOSIS — E7800 Pure hypercholesterolemia, unspecified: Secondary | ICD-10-CM

## 2015-01-25 DIAGNOSIS — Z9889 Other specified postprocedural states: Secondary | ICD-10-CM | POA: Diagnosis not present

## 2015-01-25 DIAGNOSIS — E119 Type 2 diabetes mellitus without complications: Secondary | ICD-10-CM | POA: Diagnosis not present

## 2015-01-25 DIAGNOSIS — Z6828 Body mass index (BMI) 28.0-28.9, adult: Secondary | ICD-10-CM | POA: Diagnosis not present

## 2015-01-25 DIAGNOSIS — Z85828 Personal history of other malignant neoplasm of skin: Secondary | ICD-10-CM | POA: Diagnosis not present

## 2015-01-25 DIAGNOSIS — M952 Other acquired deformity of head: Secondary | ICD-10-CM | POA: Diagnosis not present

## 2015-01-25 LAB — BASIC METABOLIC PANEL
BUN: 19 mg/dL (ref 6–23)
CHLORIDE: 100 meq/L (ref 96–112)
CO2: 31 meq/L (ref 19–32)
Calcium: 9.5 mg/dL (ref 8.4–10.5)
Creatinine, Ser: 1.1 mg/dL (ref 0.40–1.20)
GFR: 50.13 mL/min — ABNORMAL LOW (ref >60.00)
Glucose, Bld: 119 mg/dL — ABNORMAL HIGH (ref 70–99)
Potassium: 3.9 mEq/L (ref 3.5–5.1)
Sodium: 137 mEq/L (ref 135–145)

## 2015-01-25 LAB — CBC WITH DIFFERENTIAL/PLATELET
BASOS ABS: 0 10*3/uL (ref 0.0–0.1)
Basophils Relative: 0.4 % (ref 0.0–3.0)
EOS PCT: 4 % (ref 0.0–5.0)
Eosinophils Absolute: 0.3 10*3/uL (ref 0.0–0.7)
HCT: 39 % (ref 36.0–46.0)
Hemoglobin: 13.3 g/dL (ref 12.0–15.0)
Lymphocytes Relative: 40 % (ref 12.0–46.0)
Lymphs Abs: 2.9 10*3/uL (ref 0.7–4.0)
MCHC: 34.1 g/dL (ref 30.0–36.0)
MCV: 86.4 fl (ref 78.0–100.0)
MONO ABS: 0.6 10*3/uL (ref 0.1–1.0)
Monocytes Relative: 8.9 % (ref 3.0–12.0)
NEUTROS PCT: 46.7 % (ref 43.0–77.0)
Neutro Abs: 3.4 10*3/uL (ref 1.4–7.7)
Platelets: 269 10*3/uL (ref 150.0–400.0)
RBC: 4.52 Mil/uL (ref 3.87–5.11)
RDW: 14.1 % (ref 11.5–15.5)
WBC: 7.2 10*3/uL (ref 4.0–10.5)

## 2015-01-25 LAB — LIPID PANEL
CHOLESTEROL: 203 mg/dL — AB (ref 0–200)
HDL: 58.3 mg/dL (ref >39.00)
NonHDL: 144.7
Total CHOL/HDL Ratio: 3
Triglycerides: 365 mg/dL — ABNORMAL HIGH (ref 0.0–149.0)
VLDL: 73 mg/dL — ABNORMAL HIGH (ref 0.0–40.0)

## 2015-01-25 LAB — HEPATIC FUNCTION PANEL
ALT: 23 U/L (ref 0–35)
AST: 22 U/L (ref 0–37)
Albumin: 4 g/dL (ref 3.5–5.2)
Alkaline Phosphatase: 68 U/L (ref 39–117)
Bilirubin, Direct: 0.1 mg/dL (ref 0.0–0.3)
TOTAL PROTEIN: 6.9 g/dL (ref 6.0–8.3)
Total Bilirubin: 0.5 mg/dL (ref 0.2–1.2)

## 2015-01-25 LAB — HEMOGLOBIN A1C: Hgb A1c MFr Bld: 6.7 % — ABNORMAL HIGH (ref 4.6–6.5)

## 2015-01-25 LAB — LDL CHOLESTEROL, DIRECT: Direct LDL: 97 mg/dL

## 2015-01-27 ENCOUNTER — Ambulatory Visit (INDEPENDENT_AMBULATORY_CARE_PROVIDER_SITE_OTHER): Payer: Medicare Other | Admitting: Internal Medicine

## 2015-01-27 ENCOUNTER — Encounter: Payer: Self-pay | Admitting: Internal Medicine

## 2015-01-27 VITALS — BP 120/60 | HR 80 | Temp 98.0°F | Ht 60.4 in | Wt 165.1 lb

## 2015-01-27 DIAGNOSIS — R21 Rash and other nonspecific skin eruption: Secondary | ICD-10-CM

## 2015-01-27 DIAGNOSIS — I10 Essential (primary) hypertension: Secondary | ICD-10-CM | POA: Diagnosis not present

## 2015-01-27 DIAGNOSIS — Z0000 Encounter for general adult medical examination without abnormal findings: Secondary | ICD-10-CM

## 2015-01-27 DIAGNOSIS — E669 Obesity, unspecified: Secondary | ICD-10-CM

## 2015-01-27 DIAGNOSIS — E7800 Pure hypercholesterolemia, unspecified: Secondary | ICD-10-CM

## 2015-01-27 DIAGNOSIS — D649 Anemia, unspecified: Secondary | ICD-10-CM | POA: Diagnosis not present

## 2015-01-27 DIAGNOSIS — E119 Type 2 diabetes mellitus without complications: Secondary | ICD-10-CM | POA: Diagnosis not present

## 2015-01-27 DIAGNOSIS — Z86010 Personal history of colonic polyps: Secondary | ICD-10-CM

## 2015-01-27 NOTE — Progress Notes (Signed)
Pre visit review using our clinic review tool, if applicable. No additional management support is needed unless otherwise documented below in the visit note. 

## 2015-01-27 NOTE — Progress Notes (Signed)
Patient ID: Tina Watson, female   DOB: 1927/12/16, 81 y.o.   MRN: [id number]   Subjective:    Patient ID: Tina Watson, female    DOB: November 18, 1927, 81 y.o.   MRN: [id number]  HPI  Patient here for a scheduled follow up.  Increased stress with her daughter's issues.  Discussed with her today.  She is eating and drinking well.  No nausea or vomiting.  Bowels stable.  Persistent rash anterior chest.  Has tried creams.  Due to f/u with Dr Phillip Heal.    Past Medical History  Diagnosis Date  . Hypertension   . Hypercholesterolemia   . Granuloma annulare   . Osteoporosis   . Diverticulosis   . Anemia   . Diabetes mellitus without complication   . Cancer     Skin  . Colon polyps   . Chicken pox     Current Outpatient Prescriptions on File Prior to Visit  Medication Sig Dispense Refill  . Calcium Carbonate-Vitamin D (CALCIUM 600+D) 600-400 MG-UNIT per tablet Take 1 tablet by mouth daily.    . ferrous sulfate 325 (65 FE) MG tablet TAKE 1 TABLET BY MOUTH DAILY 30 tablet 5  . fish oil-omega-3 fatty acids 1000 MG capsule Take 1 g by mouth daily.    Marland Kitchen glucose blood (BAYER CONTOUR NEXT TEST) test strip TEST ONCE DAILY 100 each 2  . latanoprost (XALATAN) 0.005 % ophthalmic solution Place 1 drop into both eyes at bedtime.    . Multiple Vitamin (MULTIVITAMIN) tablet Take 1 tablet by mouth daily.    . simvastatin (ZOCOR) 40 MG tablet Take 1 tablet (40 mg total) by mouth daily. 90 tablet 1  . telmisartan (MICARDIS) 40 MG tablet Take 1 tablet (40 mg total) by mouth daily. 90 tablet 1  . triamterene-hydrochlorothiazide (MAXZIDE-25) 37.5-25 MG per tablet TAKE 1/2 TABLET ONCE A DAY 30 tablet 5   No current facility-administered medications on file prior to visit.    Review of Systems  Constitutional: Negative for appetite change and unexpected weight change.  HENT: Negative for congestion and sinus pressure.   Respiratory: Negative for cough, chest tightness and shortness of breath.   Cardiovascular:  Negative for chest pain, palpitations and leg swelling.  Gastrointestinal: Negative for nausea, vomiting, abdominal pain and diarrhea.  Skin: Positive for rash (persistent anterior chest.  is better. ). Negative for color change.  Neurological: Negative for dizziness, light-headedness and headaches.  Psychiatric/Behavioral: Negative for decreased concentration and agitation.       Objective:     Blood pressure recheck:  134/78  Physical Exam  Constitutional: She appears well-developed and well-nourished. No distress.  HENT:  Nose: Nose normal.  Mouth/Throat: Oropharynx is clear and moist.  Neck: Neck supple. No thyromegaly present.  Cardiovascular: Normal rate and regular rhythm.   Pulmonary/Chest: Breath sounds normal. No respiratory distress. She has no wheezes.  Abdominal: Soft. Bowel sounds are normal. There is no tenderness.  Musculoskeletal: She exhibits no edema or tenderness.  Lymphadenopathy:    She has no cervical adenopathy.  Skin: Rash (persistent faint rash anterior chest.  ) noted. No erythema.  Psychiatric: She has a normal mood and affect. Her behavior is normal.    BP 120/60 mmHg  Pulse 80  Temp(Src) 98 F (36.7 C) (Oral)  Ht 5' 0.4" (1.534 m)  Wt 165 lb 2 oz (74.9 kg)  BMI 31.83 kg/m2  SpO2 94%  LMP 09/13/1984 Wt Readings from Last 3 Encounters:  01/27/15 165 lb 2 oz (  74.9 kg)  09/28/14 162 lb (73.483 kg)  06/08/14 162 lb 12 oz (73.823 kg)     Lab Results  Component Value Date   WBC 7.2 01/25/2015   HGB 13.3 01/25/2015   HCT 39.0 01/25/2015   PLT 269.0 01/25/2015   GLUCOSE 119* 01/25/2015   CHOL 203* 01/25/2015   TRIG 365.0* 01/25/2015   HDL 58.30 01/25/2015   LDLDIRECT 97.0 01/25/2015   ALT 23 01/25/2015   AST 22 01/25/2015   NA 137 01/25/2015   K 3.9 01/25/2015   CL 100 01/25/2015   CREATININE 1.10 01/25/2015   BUN 19 01/25/2015   CO2 31 01/25/2015   TSH 3.03 09/15/2014   HGBA1C 6.7* 01/25/2015   MICROALBUR 1.3 09/15/2014         Assessment & Plan:   Problem List Items Addressed This Visit    Anemia    Evaluated by Dr Vladimir Creeks.  Follow cbc.        Diabetes    Low carb diet and exercise.  Follow metabolic panel and Y7C.        Relevant Orders   Hemoglobin A1c   Health care maintenance    Physical 06/08/14.  Colonoscopy 12/16/13.  Recommended f/u colonoscopy in five years.  Mammogram 07/02/14 - Birads I.        History of colonic polyps    Colonoscopy 12/16/13 as outlined.  Recommended f/u colonoscopy in five years.       Hypercholesterolemia    Discussed diet and exercise.  On simvastatin.  Follow lipid panel and liver function tests.   Lab Results  Component Value Date   CHOL 203* 01/25/2015   HDL 58.30 01/25/2015   LDLDIRECT 97.0 01/25/2015   TRIG 365.0* 01/25/2015   CHOLHDL 3 01/25/2015        Relevant Orders   Lipid panel   Hepatic function panel   Hypertension - Primary    Blood pressure doing well.  Same medication regimen.  Follow pressures.  Follow metabolic panel.       Relevant Orders   Basic metabolic panel   Obesity (BMI 30-39.9)    Discussed diet and exercise.        Rash    Persistent rash.  Continue f/u with Dr Phillip Heal.          I spent 25 minutes with the patient and more than 50% of the time was spent in consultation regarding the above.     Einar Pheasant, MD

## 2015-02-07 ENCOUNTER — Encounter: Payer: Self-pay | Admitting: Internal Medicine

## 2015-02-07 DIAGNOSIS — Z0000 Encounter for general adult medical examination without abnormal findings: Secondary | ICD-10-CM | POA: Insufficient documentation

## 2015-02-07 NOTE — Assessment & Plan Note (Signed)
Low carb diet and exercise.  Follow metabolic panel and a1c.   

## 2015-02-07 NOTE — Assessment & Plan Note (Signed)
Blood pressure doing well.  Same medication regimen.  Follow pressures.  Follow metabolic panel.   

## 2015-02-07 NOTE — Assessment & Plan Note (Signed)
Discussed diet and exercise 

## 2015-02-07 NOTE — Assessment & Plan Note (Signed)
Colonoscopy 12/16/13 as outlined.  Recommended f/u colonoscopy in five years.

## 2015-02-07 NOTE — Assessment & Plan Note (Addendum)
Physical 06/08/14.  Colonoscopy 12/16/13.  Recommended f/u colonoscopy in five years.  Mammogram 07/02/14 - Birads I.

## 2015-02-07 NOTE — Assessment & Plan Note (Signed)
Discussed diet and exercise.  On simvastatin.  Follow lipid panel and liver function tests.   Lab Results  Component Value Date   CHOL 203* 01/25/2015   HDL 58.30 01/25/2015   LDLDIRECT 97.0 01/25/2015   TRIG 365.0* 01/25/2015   CHOLHDL 3 01/25/2015

## 2015-02-07 NOTE — Assessment & Plan Note (Signed)
Persistent rash.  Continue f/u with Dr Phillip Heal.

## 2015-02-07 NOTE — Assessment & Plan Note (Signed)
Evaluated by Dr Vladimir Creeks.  Follow cbc.

## 2015-02-08 ENCOUNTER — Other Ambulatory Visit: Payer: Self-pay | Admitting: Internal Medicine

## 2015-02-23 DIAGNOSIS — Z6827 Body mass index (BMI) 27.0-27.9, adult: Secondary | ICD-10-CM | POA: Diagnosis not present

## 2015-02-23 DIAGNOSIS — M952 Other acquired deformity of head: Secondary | ICD-10-CM | POA: Diagnosis not present

## 2015-02-23 DIAGNOSIS — Z9889 Other specified postprocedural states: Secondary | ICD-10-CM | POA: Diagnosis not present

## 2015-02-23 DIAGNOSIS — Z85828 Personal history of other malignant neoplasm of skin: Secondary | ICD-10-CM | POA: Diagnosis not present

## 2015-04-17 ENCOUNTER — Other Ambulatory Visit: Payer: Self-pay | Admitting: Internal Medicine

## 2015-06-02 ENCOUNTER — Encounter: Payer: Self-pay | Admitting: Internal Medicine

## 2015-06-02 ENCOUNTER — Ambulatory Visit (INDEPENDENT_AMBULATORY_CARE_PROVIDER_SITE_OTHER): Payer: Medicare Other | Admitting: Internal Medicine

## 2015-06-02 VITALS — BP 110/70 | HR 77 | Temp 98.1°F | Resp 18 | Ht 60.4 in | Wt 163.8 lb

## 2015-06-02 DIAGNOSIS — E119 Type 2 diabetes mellitus without complications: Secondary | ICD-10-CM

## 2015-06-02 DIAGNOSIS — I10 Essential (primary) hypertension: Secondary | ICD-10-CM

## 2015-06-02 DIAGNOSIS — E7800 Pure hypercholesterolemia, unspecified: Secondary | ICD-10-CM

## 2015-06-02 DIAGNOSIS — D649 Anemia, unspecified: Secondary | ICD-10-CM

## 2015-06-02 DIAGNOSIS — E669 Obesity, unspecified: Secondary | ICD-10-CM

## 2015-06-02 DIAGNOSIS — Z86010 Personal history of colonic polyps: Secondary | ICD-10-CM

## 2015-06-02 DIAGNOSIS — Z23 Encounter for immunization: Secondary | ICD-10-CM

## 2015-06-02 LAB — HEMOGLOBIN A1C: Hgb A1c MFr Bld: 6.6 % — ABNORMAL HIGH (ref 4.6–6.5)

## 2015-06-02 LAB — BASIC METABOLIC PANEL
BUN: 28 mg/dL — ABNORMAL HIGH (ref 6–23)
CHLORIDE: 101 meq/L (ref 96–112)
CO2: 33 mEq/L — ABNORMAL HIGH (ref 19–32)
CREATININE: 1.07 mg/dL (ref 0.40–1.20)
Calcium: 9.7 mg/dL (ref 8.4–10.5)
GFR: 51.71 mL/min — ABNORMAL LOW (ref >60.00)
Glucose, Bld: 107 mg/dL — ABNORMAL HIGH (ref 70–99)
Potassium: 3.9 mEq/L (ref 3.5–5.1)
SODIUM: 139 meq/L (ref 135–145)

## 2015-06-02 LAB — LIPID PANEL
CHOL/HDL RATIO: 3
Cholesterol: 205 mg/dL — ABNORMAL HIGH (ref 0–200)
HDL: 59.8 mg/dL (ref >39.00)
NonHDL: 145.5
Triglycerides: 345 mg/dL — ABNORMAL HIGH (ref 0.0–149.0)
VLDL: 69 mg/dL — AB (ref 0.0–40.0)

## 2015-06-02 LAB — HEPATIC FUNCTION PANEL
ALBUMIN: 4.3 g/dL (ref 3.5–5.2)
ALT: 24 U/L (ref 0–35)
AST: 21 U/L (ref 0–37)
Alkaline Phosphatase: 64 U/L (ref 39–117)
BILIRUBIN TOTAL: 0.7 mg/dL (ref 0.2–1.2)
Bilirubin, Direct: 0.1 mg/dL (ref 0.0–0.3)
Total Protein: 7.3 g/dL (ref 6.0–8.3)

## 2015-06-02 LAB — LDL CHOLESTEROL, DIRECT: LDL DIRECT: 108 mg/dL

## 2015-06-02 NOTE — Assessment & Plan Note (Signed)
Discussed diet and exercise.  Low carb diet.  Follow met b and a1c.

## 2015-06-02 NOTE — Assessment & Plan Note (Signed)
Low cholesterol diet and exercise.  On simvastatin.  Follow lipid panel and liver function tests.   

## 2015-06-02 NOTE — Assessment & Plan Note (Signed)
Evaluated by Dr Yabanez.  Follow cbc.   

## 2015-06-02 NOTE — Assessment & Plan Note (Signed)
Blood pressure under good control.  Continue same medication regimen.  Follow pressures.  Follow metabolic panel.   

## 2015-06-02 NOTE — Progress Notes (Signed)
Pre-visit discussion using our clinic review tool. No additional management support is needed unless otherwise documented below in the visit note.  

## 2015-06-02 NOTE — Assessment & Plan Note (Signed)
Colonoscopy 12/26/13 as outlined.  Bowels are doing well.  They recommended f/u colonoscopy in five years.  She desires no further colonoscopy.

## 2015-06-02 NOTE — Patient Instructions (Signed)

## 2015-06-02 NOTE — Progress Notes (Signed)
Patient ID: Tina Watson, female   DOB: March 08, 1928, 81 y.o.   MRN: [id number]   Subjective:    Patient ID: Tina Watson, female    DOB: 05/27/1928, 81 y.o.   MRN: [id number]  HPI  Patient with history of hypertension, diabetes and hypercholesterolemia.  She comes in today to follow up on these issues.  Is busy.  Stays active.  No cardiac symptoms with increased activity or exertion.  No sob.  No acid reflux.  No abdominal pain or cramping.  Bowels stable.  Desires not to have any further mammograms.  Feels she is handling stress well.     Past Medical History  Diagnosis Date  . Hypertension   . Hypercholesterolemia   . Granuloma annulare   . Osteoporosis   . Diverticulosis   . Anemia   . Diabetes mellitus without complication   . Cancer     Skin  . Colon polyps   . Chicken pox    Past Surgical History  Procedure Laterality Date  . Tonsillectomy  1952  . Vaginal hysterectomy with anterior repair  1990  . Mohs surgery  07/2014    BCCA on nose   Family History  Problem Relation Age of Onset  . Stroke Father   . Kidney failure Father   . Cancer Mother     question of rectum/vaginal  . Heart disease Brother     x4  . Stroke Sister   . Dementia Sister   . Breast cancer Neg Hx   . Colon cancer Neg Hx    Social History   Social History  . Marital Status: Widowed    Spouse Name: N/A  . Number of Children: 2  . Years of Education: N/A   Social History Main Topics  . Smoking status: Never Smoker   . Smokeless tobacco: Never Used  . Alcohol Use: No  . Drug Use: No  . Sexual Activity: Not Asked   Other Topics Concern  . None   Social History Narrative     Review of Systems  Constitutional: Negative for appetite change and unexpected weight change.  HENT: Negative for congestion and sinus pressure.   Respiratory: Negative for cough, chest tightness and shortness of breath.   Cardiovascular: Negative for chest pain, palpitations and leg swelling.  Gastrointestinal:  Negative for nausea, vomiting, abdominal pain and diarrhea.  Genitourinary: Negative for dysuria and difficulty urinating.  Skin: Negative for color change and rash.  Neurological: Negative for dizziness, light-headedness and headaches.  Psychiatric/Behavioral: Negative for dysphoric mood and agitation.       Handling stress.         Objective:     Blood pressure rechecked by me:  128/78  Physical Exam  Constitutional: She appears well-developed and well-nourished. No distress.  HENT:  Nose: Nose normal.  Mouth/Throat: Oropharynx is clear and moist.  Eyes: Conjunctivae are normal. Right eye exhibits no discharge. Left eye exhibits no discharge.  Neck: Neck supple. No thyromegaly present.  Cardiovascular: Normal rate and regular rhythm.   Pulmonary/Chest: Breath sounds normal. No respiratory distress. She has no wheezes.  Abdominal: Soft. Bowel sounds are normal. There is no tenderness.  Musculoskeletal: She exhibits no edema or tenderness.  Lymphadenopathy:    She has no cervical adenopathy.  Skin: No rash noted. No erythema.  Psychiatric: She has a normal mood and affect. Her behavior is normal.    BP 110/70 mmHg  Pulse 77  Temp(Src) 98.1 F (36.7 C) (Oral)  Resp  18  Ht 5' 0.4" (1.534 m)  Wt 163 lb 12 oz (74.277 kg)  BMI 31.56 kg/m2  SpO2 94%  LMP 09/13/1984 Wt Readings from Last 3 Encounters:  06/02/15 163 lb 12 oz (74.277 kg)  01/27/15 165 lb 2 oz (74.9 kg)  09/28/14 162 lb (73.483 kg)     Lab Results  Component Value Date   WBC 7.2 01/25/2015   HGB 13.3 01/25/2015   HCT 39.0 01/25/2015   PLT 269.0 01/25/2015   GLUCOSE 119* 01/25/2015   CHOL 203* 01/25/2015   TRIG 365.0* 01/25/2015   HDL 58.30 01/25/2015   LDLDIRECT 97.0 01/25/2015   ALT 23 01/25/2015   AST 22 01/25/2015   NA 137 01/25/2015   K 3.9 01/25/2015   CL 100 01/25/2015   CREATININE 1.10 01/25/2015   BUN 19 01/25/2015   CO2 31 01/25/2015   TSH 3.03 09/15/2014   HGBA1C 6.7* 01/25/2015    MICROALBUR 1.3 09/15/2014       Assessment & Plan:   Problem List Items Addressed This Visit    Anemia    Evaluated by Dr Vladimir Creeks.  Follow cbc.       Diabetes    Discussed diet and exercise.  Low carb diet.  Follow met b and a1c.        History of colonic polyps    Colonoscopy 12/26/13 as outlined.  Bowels are doing well.  They recommended f/u colonoscopy in five years.  She desires no further colonoscopy.        Hypercholesterolemia    Low cholesterol diet and exercise.  On simvastatin.  Follow lipid panel and liver function tests.        Hypertension    Blood pressure under good control.  Continue same medication regimen.  Follow pressures.  Follow metabolic panel.        Obesity (BMI 30-39.9)    Diet and exercise.  Follow.         Other Visit Diagnoses    Encounter for immunization    -  Primary        Einar Pheasant, MD

## 2015-06-02 NOTE — Assessment & Plan Note (Signed)
Diet and exercise.  Follow.  

## 2015-06-08 DIAGNOSIS — H40003 Preglaucoma, unspecified, bilateral: Secondary | ICD-10-CM | POA: Diagnosis not present

## 2015-06-15 DIAGNOSIS — L578 Other skin changes due to chronic exposure to nonionizing radiation: Secondary | ICD-10-CM | POA: Diagnosis not present

## 2015-06-15 DIAGNOSIS — H401131 Primary open-angle glaucoma, bilateral, mild stage: Secondary | ICD-10-CM | POA: Diagnosis not present

## 2015-06-15 DIAGNOSIS — Z9889 Other specified postprocedural states: Secondary | ICD-10-CM | POA: Diagnosis not present

## 2015-06-15 DIAGNOSIS — Z85828 Personal history of other malignant neoplasm of skin: Secondary | ICD-10-CM | POA: Diagnosis not present

## 2015-07-07 DIAGNOSIS — Z85828 Personal history of other malignant neoplasm of skin: Secondary | ICD-10-CM | POA: Diagnosis not present

## 2015-07-07 DIAGNOSIS — L2084 Intrinsic (allergic) eczema: Secondary | ICD-10-CM | POA: Diagnosis not present

## 2015-07-07 DIAGNOSIS — Z08 Encounter for follow-up examination after completed treatment for malignant neoplasm: Secondary | ICD-10-CM | POA: Diagnosis not present

## 2015-07-07 DIAGNOSIS — Z1283 Encounter for screening for malignant neoplasm of skin: Secondary | ICD-10-CM | POA: Diagnosis not present

## 2015-08-05 ENCOUNTER — Other Ambulatory Visit: Payer: Self-pay | Admitting: Internal Medicine

## 2015-10-26 ENCOUNTER — Other Ambulatory Visit: Payer: Self-pay | Admitting: Internal Medicine

## 2015-11-03 ENCOUNTER — Other Ambulatory Visit: Payer: Self-pay | Admitting: Internal Medicine

## 2015-11-18 ENCOUNTER — Other Ambulatory Visit: Payer: Self-pay | Admitting: Internal Medicine

## 2015-11-30 ENCOUNTER — Encounter: Payer: Self-pay | Admitting: Internal Medicine

## 2015-11-30 ENCOUNTER — Ambulatory Visit (INDEPENDENT_AMBULATORY_CARE_PROVIDER_SITE_OTHER): Payer: Medicare Other | Admitting: Internal Medicine

## 2015-11-30 VITALS — BP 120/70 | HR 95 | Temp 97.6°F | Resp 18 | Ht 63.0 in | Wt 166.2 lb

## 2015-11-30 DIAGNOSIS — M810 Age-related osteoporosis without current pathological fracture: Secondary | ICD-10-CM

## 2015-11-30 DIAGNOSIS — E669 Obesity, unspecified: Secondary | ICD-10-CM

## 2015-11-30 DIAGNOSIS — I10 Essential (primary) hypertension: Secondary | ICD-10-CM | POA: Diagnosis not present

## 2015-11-30 DIAGNOSIS — E119 Type 2 diabetes mellitus without complications: Secondary | ICD-10-CM

## 2015-11-30 DIAGNOSIS — E7800 Pure hypercholesterolemia, unspecified: Secondary | ICD-10-CM

## 2015-11-30 DIAGNOSIS — Z860100 Personal history of colon polyps, unspecified: Secondary | ICD-10-CM

## 2015-11-30 DIAGNOSIS — Z0000 Encounter for general adult medical examination without abnormal findings: Secondary | ICD-10-CM

## 2015-11-30 DIAGNOSIS — H409 Unspecified glaucoma: Secondary | ICD-10-CM

## 2015-11-30 NOTE — Progress Notes (Addendum)
Subjective:   Tina Watson is a 82 y.o. female who presents for an Initial Medicare Annual Wellness Visit.  Review of Systems    No ROS.  Medicare Wellness Visit.  Cardiac Risk Factors include: advanced age (>20men, >52 women);diabetes mellitus;hypertension     Objective:    Today's Vitals   11/30/15 1434  BP: 120/70  Pulse: 95  Temp: 97.6 F (36.4 C)  TempSrc: Oral  Resp: 18  Height: 5\' 3"  (1.6 m)  Weight: 166 lb 4 oz (75.411 kg)  SpO2: 94%   Body mass index is 29.46 kg/(m^2).   Current Medications (verified) Outpatient Encounter Prescriptions as of 11/30/2015  Medication Sig  . ammonium lactate (LAC-HYDRIN) 12 % lotion Apply 1 application topically as needed for dry skin. Apply to arms as needed  . Calcium Carbonate-Vitamin D (CALCIUM 600+D) 600-400 MG-UNIT per tablet Take 1 tablet by mouth daily.  . ferrous sulfate 325 (65 FE) MG tablet TAKE 1 TABLET BY MOUTH DAILY  . fish oil-omega-3 fatty acids 1000 MG capsule Take 1 g by mouth daily.  Marland Kitchen glucose blood (BAYER CONTOUR NEXT TEST) test strip TEST ONCE DAILY  . latanoprost (XALATAN) 0.005 % ophthalmic solution Place 1 drop into both eyes at bedtime.  . Multiple Vitamin (MULTIVITAMIN) tablet Take 1 tablet by mouth daily.  . simvastatin (ZOCOR) 40 MG tablet Take 1 tablet (40 mg total) by mouth daily.  Marland Kitchen telmisartan (MICARDIS) 40 MG tablet TAKE 1 TABLET (40 MG TOTAL) BY MOUTH DAILY. APPT NEEDED AROUND JAUNUARY 2017  . triamterene-hydrochlorothiazide (MAXZIDE-25) 37.5-25 MG tablet TAKE 1/2 TABLET ONCE A DAY   No facility-administered encounter medications on file as of 11/30/2015.    Allergies (verified) Demerol and Lescol   History: Past Medical History  Diagnosis Date  . Hypertension   . Hypercholesterolemia   . Granuloma annulare   . Osteoporosis   . Diverticulosis   . Anemia   . Diabetes mellitus without complication ([place])   . Cancer (HCC)     Skin  . Colon polyps   . Chicken pox    Past Surgical History    Procedure Laterality Date  . Tonsillectomy  1952  . Vaginal hysterectomy with anterior repair  1990  . Mohs surgery  07/2014    BCCA on nose   Family History  Problem Relation Age of Onset  . Stroke Father   . Kidney failure Father   . Cancer Mother     question of rectum/vaginal  . Heart disease Brother     x4  . Stroke Sister   . Dementia Sister   . Breast cancer Neg Hx   . Colon cancer Neg Hx    Social History   Occupational History  . Not on file.   Social History Main Topics  . Smoking status: Never Smoker   . Smokeless tobacco: Never Used  . Alcohol Use: No  . Drug Use: No  . Sexual Activity: No    Tobacco Counseling Counseling given: Not Answered   Activities of Daily Living In your present state of health, do you have any difficulty performing the following activities: 11/30/2015  Hearing? Y  Vision? N  Difficulty concentrating or making decisions? N  Walking or climbing stairs? N  Dressing or bathing? N  Doing errands, shopping? N  Preparing Food and eating ? N  Using the Toilet? N  In the past six months, have you accidently leaked urine? Y  Do you have problems with loss of bowel control?  N  Managing your Medications? N  Managing your Finances? N  Housekeeping or managing your Housekeeping? N    Immunizations and Health Maintenance Immunization History  Administered Date(s) Administered  . Influenza Split 06/13/2012, 07/04/2012  . Influenza,inj,Quad PF,36+ Mos 06/25/2013, 06/08/2014, 06/02/2015   Health Maintenance Due  Topic Date Due  . OPHTHALMOLOGY EXAM  09/12/1939  . TETANUS/TDAP  09/11/1948  . DEXA SCAN  09/11/1994  . PNA vac Low Risk Adult (1 of 2 - PCV13) 09/11/1994  . HEMOGLOBIN A1C  11/30/2015    Patient Care Team: Einar Pheasant, MD as PCP - General (Internal Medicine)  Indicate any recent Medical Services you may have received from other than [facility] providers in the past year (date may be approximate).     Assessment:    This is a routine wellness examination for [name].  The goal of the wellness visit is to assist the patient how to close the gaps in care and create a preventative care plan for the patient.   Taking VIT D Calcium as appropriate/Osteoporosis risk reviewed. DEXA Scan referral postponed, per patient request.  Medications reviewed; taking without issues or barriers.  Safety issues reviewed; smoke detectors in the home. No firearms in the home. Wears seatbelts when driving or riding with others. No violence in the home.  No identified risk were noted; The patient was oriented x 3; appropriate in dress and manner and no objective failures at ADL's or IADL's.   TDAP vaccine postponed for follow up with insurance, per patient request.   Opthalmology appointment scheduled.  Prevnar 13 vaccine postponed for follow up appointment, per patient request.  Patient Concerns:  None at this time.  Follow up with PCP as needed.  Hearing/Vision screen Hearing Screening Comments: Wears hearing aids Vision Screening Comments: Followed by [facility] Wear glasses Annual visits  Dietary issues and exercise activities discussed: Current Exercise Habits: The patient does not participate in regular exercise at present  Goals    . Healthy Lifestyle     Walk one time a week, 30 minutes.  Increase as tolerated. Stay hydrated!  Drink plenty of water. Low carb foods.  Lean meats, fruits and vegetables.       Depression Screen PHQ 2/9 Scores 11/30/2015 11/30/2015 01/27/2015 03/26/2013 09/15/2012  PHQ - 2 Score 0 0 0 0 0    Fall Risk Fall Risk  11/30/2015 11/30/2015 01/27/2015 03/26/2013 09/15/2012  Falls in the past year? No No No No No    Cognitive Function: MMSE - Mini Mental State Exam 11/30/2015  Orientation to time 5  Orientation to Place 5  Registration 3  Attention/ Calculation 5  Recall 3  Language- name 2 objects 2  Language- repeat 1  Language- follow 3 step command 3  Language-  read & follow direction 1  Write a sentence 1  Copy design 1  Total score 30    Screening Tests Health Maintenance  Topic Date Due  . OPHTHALMOLOGY EXAM  09/12/1939  . TETANUS/TDAP  09/11/1948  . DEXA SCAN  09/11/1994  . PNA vac Low Risk Adult (1 of 2 - PCV13) 09/11/1994  . HEMOGLOBIN A1C  11/30/2015  . MAMMOGRAM  11/29/2016 (Originally 07/03/2015)  . INFLUENZA VACCINE  04/11/2016  . FOOT EXAM  06/01/2016  . ZOSTAVAX  Addressed      Plan:   End of life planning; Advance aging; Advanced directives discussed. Copy of current HCPOA/Living Will requested.    During the course of the visit, Marybel was  educated and counseled about the following appropriate screening and preventive services:   Vaccines to include Pneumoccal, Influenza, Hepatitis B, Td, Zostavax, HCV  Electrocardiogram  Cardiovascular disease screening  Colorectal cancer screening  Bone density screening  Diabetes screening  Glaucoma screening  Mammography/PAP  Nutrition counseling  Smoking cessation counseling  Patient Instructions (the written plan) were given to the patient.    Varney Biles, LPN   [id number]    Reviewed above information.  Agree with plan.    Dr Nicki Reaper

## 2015-11-30 NOTE — Progress Notes (Signed)
Pre-visit discussion using our clinic review tool. No additional management support is needed unless otherwise documented below in the visit note.  

## 2015-11-30 NOTE — Progress Notes (Signed)
Patient ID: Tina Watson, female   DOB: Aug 15, 1928, 82 y.o.   MRN: [id number]   Subjective:    Patient ID: Tina Watson, female    DOB: May 01, 1928, 82 y.o.   MRN: [id number]  HPI  Patient with past history of hypercholesterolemia, diabetes and hypertension.  She comes in today to follow up on these issues.  She reports she is doing well.  Feels good.  Eating and drinking.  Doing a lot of cooking for neighbors.  No chest pain or tightness.  No sob.  No increased cough or congestion.  No abdominal pain or cramping.  Bowels stable.  Feels she is handling stress relatively well.  Does not feel needs any further intervention.  Discussed her current stress now.  Also discussed diet and exercise.     Past Medical History  Diagnosis Date  . Hypertension   . Hypercholesterolemia   . Granuloma annulare   . Osteoporosis   . Diverticulosis   . Anemia   . Diabetes mellitus without complication ([place])   . Cancer (HCC)     Skin  . Colon polyps   . Chicken pox    Past Surgical History  Procedure Laterality Date  . Tonsillectomy  1952  . Vaginal hysterectomy with anterior repair  1990  . Mohs surgery  07/2014    BCCA on nose   Family History  Problem Relation Age of Onset  . Stroke Father   . Kidney failure Father   . Cancer Mother     question of rectum/vaginal  . Heart disease Brother     x4  . Stroke Sister   . Dementia Sister   . Breast cancer Neg Hx   . Colon cancer Neg Hx    Social History   Social History  . Marital Status: Widowed    Spouse Name: N/A  . Number of Children: 2  . Years of Education: N/A   Social History Main Topics  . Smoking status: Never Smoker   . Smokeless tobacco: Never Used  . Alcohol Use: No  . Drug Use: No  . Sexual Activity: No   Other Topics Concern  . None   Social History Narrative    Outpatient Encounter Prescriptions as of 11/30/2015  Medication Sig  . ammonium lactate (LAC-HYDRIN) 12 % lotion Apply 1 application topically as needed for  dry skin. Apply to arms as needed  . Calcium Carbonate-Vitamin D (CALCIUM 600+D) 600-400 MG-UNIT per tablet Take 1 tablet by mouth daily.  . ferrous sulfate 325 (65 FE) MG tablet TAKE 1 TABLET BY MOUTH DAILY  . fish oil-omega-3 fatty acids 1000 MG capsule Take 1 g by mouth daily.  Marland Kitchen glucose blood (BAYER CONTOUR NEXT TEST) test strip TEST ONCE DAILY  . latanoprost (XALATAN) 0.005 % ophthalmic solution Place 1 drop into both eyes at bedtime.  . Multiple Vitamin (MULTIVITAMIN) tablet Take 1 tablet by mouth daily.  . simvastatin (ZOCOR) 40 MG tablet Take 1 tablet (40 mg total) by mouth daily.  Marland Kitchen telmisartan (MICARDIS) 40 MG tablet TAKE 1 TABLET (40 MG TOTAL) BY MOUTH DAILY. APPT NEEDED AROUND JAUNUARY 2017  . triamterene-hydrochlorothiazide (MAXZIDE-25) 37.5-25 MG tablet TAKE 1/2 TABLET ONCE A DAY   No facility-administered encounter medications on file as of 11/30/2015.    Review of Systems  Constitutional: Negative for appetite change and unexpected weight change.  HENT: Negative for congestion and sinus pressure.   Eyes: Negative for pain and visual disturbance.  Respiratory: Negative for cough, chest  tightness and shortness of breath.   Cardiovascular: Negative for chest pain, palpitations and leg swelling.  Gastrointestinal: Negative for nausea, vomiting, abdominal pain and diarrhea.  Genitourinary: Negative for dysuria and difficulty urinating.  Musculoskeletal: Negative for back pain and joint swelling.  Skin: Negative for color change and rash.  Neurological: Negative for dizziness, light-headedness and headaches.  Hematological: Negative for adenopathy. Does not bruise/bleed easily.  Psychiatric/Behavioral: Negative for dysphoric mood and agitation.       Objective:    Physical Exam  Constitutional: She is oriented to person, place, and time. She appears well-developed and well-nourished. No distress.  HENT:  Nose: Nose normal.  Mouth/Throat: Oropharynx is clear and moist.    Eyes: Right eye exhibits no discharge. Left eye exhibits no discharge. No scleral icterus.  Neck: Neck supple. No thyromegaly present.  Cardiovascular: Normal rate and regular rhythm.   Pulmonary/Chest: Breath sounds normal. No accessory muscle usage. No tachypnea. No respiratory distress. She has no decreased breath sounds. She has no wheezes. She has no rhonchi. Right breast exhibits no inverted nipple, no mass, no nipple discharge and no tenderness (no axillary adenopathy). Left breast exhibits no inverted nipple, no mass, no nipple discharge and no tenderness (no axilarry adenopathy).  Abdominal: Soft. Bowel sounds are normal. There is no tenderness.  Musculoskeletal: She exhibits no edema or tenderness.  Lymphadenopathy:    She has no cervical adenopathy.  Neurological: She is alert and oriented to person, place, and time.  Skin: Skin is warm. No rash noted. No erythema.  Psychiatric: She has a normal mood and affect. Her behavior is normal.    BP 120/70 mmHg  Pulse 95  Temp(Src) 97.6 F (36.4 C) (Oral)  Resp 18  Ht _0  (1.6 m)  Wt 166 lb 4 oz (75.411 kg)  BMI 29.46 kg/m2  SpO2 94%  LMP 09/13/1984 Wt Readings from Last 3 Encounters:  11/30/15 166 lb 4 oz (75.411 kg)  06/02/15 163 lb 12 oz (74.277 kg)  01/27/15 165 lb 2 oz (74.9 kg)     Lab Results  Component Value Date   WBC 7.2 01/25/2015   HGB 13.3 01/25/2015   HCT 39.0 01/25/2015   PLT 269.0 01/25/2015   GLUCOSE 107* 06/02/2015   CHOL 205* 06/02/2015   TRIG 345.0* 06/02/2015   HDL 59.80 06/02/2015   LDLDIRECT 108.0 06/02/2015   ALT 24 06/02/2015   AST 21 06/02/2015   NA 139 06/02/2015   K 3.9 06/02/2015   CL 101 06/02/2015   CREATININE 1.07 06/02/2015   BUN 28* 06/02/2015   CO2 33* 06/02/2015   TSH 3.03 09/15/2014   HGBA1C 6.6* 06/02/2015   MICROALBUR 1.3 09/15/2014        Assessment & Plan:   Problem List Items Addressed This Visit    Diabetes ([place])    Discussed low carb diet and exercise.   Follow met b and a1c.   Lab Results  Component Value Date   HGBA1C 6.6* 06/02/2015        Glaucoma    Followed by opthalmology.       Health care maintenance    Physical today 11/30/15.  Last mammogram 07/02/14.  She declines further mammograms.  Colonoscopy 12/16/13.  Declines further colonoscopy.        History of colonic polyps    Colonoscopy 12/26/13 - as outlined in overview.  She desires no further colonoscopy.        Hypercholesterolemia    On simvastatin.  Low cholesterol diet  and exercise.  Follow lipid panel and liver function tests.        Hypertension    Blood pressure under good control.  Continue same medication regimen.  Follow pressures.  Follow metabolic panel.        Obesity (BMI 30-39.9)    Discussed diet and exercise.        Osteoporosis    Last bone density normal.  Has been a while since had bone density.  Will plan f/u in future.         Other Visit Diagnoses    Routine history and physical examination of adult    -  Primary      I spent 25 minutes with the patient and more than 50% of the time was spent in consultation regarding the above.     Einar Pheasant, MD

## 2015-12-01 ENCOUNTER — Encounter: Payer: Self-pay | Admitting: Internal Medicine

## 2015-12-01 NOTE — Assessment & Plan Note (Signed)
Blood pressure under good control.  Continue same medication regimen.  Follow pressures.  Follow metabolic panel.   

## 2015-12-01 NOTE — Assessment & Plan Note (Signed)
Physical today 11/30/15.  Last mammogram 07/02/14.  She declines further mammograms.  Colonoscopy 12/16/13.  Declines further colonoscopy.

## 2015-12-01 NOTE — Assessment & Plan Note (Signed)
Discussed low carb diet and exercise.  Follow met b and a1c.   Lab Results  Component Value Date   HGBA1C 6.6* 06/02/2015

## 2015-12-01 NOTE — Assessment & Plan Note (Signed)
Colonoscopy 12/26/13 - as outlined in overview.  She desires no further colonoscopy.

## 2015-12-01 NOTE — Assessment & Plan Note (Signed)
Discussed diet and exercise 

## 2015-12-01 NOTE — Assessment & Plan Note (Signed)
Last bone density normal.  Has been a while since had bone density.  Will plan f/u in future.

## 2015-12-01 NOTE — Assessment & Plan Note (Signed)
Followed by opthalmology.       

## 2015-12-01 NOTE — Assessment & Plan Note (Signed)
On simvastatin.  Low cholesterol diet and exercise.  Follow lipid panel and liver function tests.   

## 2015-12-14 DIAGNOSIS — H401131 Primary open-angle glaucoma, bilateral, mild stage: Secondary | ICD-10-CM | POA: Diagnosis not present

## 2016-01-25 DIAGNOSIS — Z85828 Personal history of other malignant neoplasm of skin: Secondary | ICD-10-CM | POA: Diagnosis not present

## 2016-01-25 DIAGNOSIS — Z9889 Other specified postprocedural states: Secondary | ICD-10-CM | POA: Diagnosis not present

## 2016-01-25 DIAGNOSIS — L578 Other skin changes due to chronic exposure to nonionizing radiation: Secondary | ICD-10-CM | POA: Diagnosis not present

## 2016-01-25 DIAGNOSIS — C4490 Unspecified malignant neoplasm of skin, unspecified: Secondary | ICD-10-CM | POA: Diagnosis not present

## 2016-01-25 DIAGNOSIS — M952 Other acquired deformity of head: Secondary | ICD-10-CM | POA: Diagnosis not present

## 2016-02-22 ENCOUNTER — Other Ambulatory Visit: Payer: Self-pay | Admitting: Internal Medicine

## 2016-03-28 ENCOUNTER — Telehealth: Payer: Self-pay

## 2016-03-28 DIAGNOSIS — E7800 Pure hypercholesterolemia, unspecified: Secondary | ICD-10-CM

## 2016-03-28 DIAGNOSIS — D649 Anemia, unspecified: Secondary | ICD-10-CM

## 2016-03-28 DIAGNOSIS — I10 Essential (primary) hypertension: Secondary | ICD-10-CM

## 2016-03-28 DIAGNOSIS — M810 Age-related osteoporosis without current pathological fracture: Secondary | ICD-10-CM

## 2016-03-28 DIAGNOSIS — E119 Type 2 diabetes mellitus without complications: Secondary | ICD-10-CM

## 2016-03-28 NOTE — Telephone Encounter (Signed)
Pt coming for labs 03/29/16. Please place future orders. Thank you.

## 2016-03-29 ENCOUNTER — Other Ambulatory Visit (INDEPENDENT_AMBULATORY_CARE_PROVIDER_SITE_OTHER): Payer: Medicare Other

## 2016-03-29 DIAGNOSIS — M810 Age-related osteoporosis without current pathological fracture: Secondary | ICD-10-CM

## 2016-03-29 DIAGNOSIS — E7800 Pure hypercholesterolemia, unspecified: Secondary | ICD-10-CM

## 2016-03-29 DIAGNOSIS — E119 Type 2 diabetes mellitus without complications: Secondary | ICD-10-CM | POA: Diagnosis not present

## 2016-03-29 DIAGNOSIS — I10 Essential (primary) hypertension: Secondary | ICD-10-CM | POA: Diagnosis not present

## 2016-03-29 LAB — CBC WITH DIFFERENTIAL/PLATELET
BASOS ABS: 0 10*3/uL (ref 0.0–0.1)
Basophils Relative: 0.4 % (ref 0.0–3.0)
EOS ABS: 0.3 10*3/uL (ref 0.0–0.7)
Eosinophils Relative: 3.9 % (ref 0.0–5.0)
HCT: 38.9 % (ref 36.0–46.0)
Hemoglobin: 13 g/dL (ref 12.0–15.0)
LYMPHS ABS: 3.5 10*3/uL (ref 0.7–4.0)
Lymphocytes Relative: 42.6 % (ref 12.0–46.0)
MCHC: 33.5 g/dL (ref 30.0–36.0)
MCV: 88.9 fl (ref 78.0–100.0)
MONOS PCT: 7.7 % (ref 3.0–12.0)
Monocytes Absolute: 0.6 10*3/uL (ref 0.1–1.0)
NEUTROS PCT: 45.4 % (ref 43.0–77.0)
Neutro Abs: 3.7 10*3/uL (ref 1.4–7.7)
PLATELETS: 259 10*3/uL (ref 150.0–400.0)
RBC: 4.37 Mil/uL (ref 3.87–5.11)
RDW: 13.3 % (ref 11.5–15.5)
WBC: 8.1 10*3/uL (ref 4.0–10.5)

## 2016-03-29 LAB — HEMOGLOBIN A1C: Hgb A1c MFr Bld: 6.7 % — ABNORMAL HIGH (ref 4.6–6.5)

## 2016-03-29 LAB — TSH: TSH: 2.96 u[IU]/mL (ref 0.35–4.50)

## 2016-03-29 LAB — BASIC METABOLIC PANEL
BUN: 30 mg/dL — AB (ref 6–23)
CALCIUM: 9.5 mg/dL (ref 8.4–10.5)
CO2: 28 mEq/L (ref 19–32)
CREATININE: 1.15 mg/dL (ref 0.40–1.20)
Chloride: 102 mEq/L (ref 96–112)
GFR: 47.49 mL/min — AB (ref >60.00)
GLUCOSE: 124 mg/dL — AB (ref 70–99)
Potassium: 3.9 mEq/L (ref 3.5–5.1)
Sodium: 139 mEq/L (ref 135–145)

## 2016-03-29 LAB — LIPID PANEL
CHOLESTEROL: 196 mg/dL (ref 0–200)
HDL: 52.7 mg/dL (ref >39.00)
NONHDL: 143.5
TRIGLYCERIDES: 344 mg/dL — AB (ref 0.0–149.0)
Total CHOL/HDL Ratio: 4
VLDL: 68.8 mg/dL — ABNORMAL HIGH (ref 0.0–40.0)

## 2016-03-29 LAB — HEPATIC FUNCTION PANEL
ALBUMIN: 4.1 g/dL (ref 3.5–5.2)
ALK PHOS: 65 U/L (ref 39–117)
ALT: 21 U/L (ref 0–35)
AST: 20 U/L (ref 0–37)
Bilirubin, Direct: 0.1 mg/dL (ref 0.0–0.3)
TOTAL PROTEIN: 6.9 g/dL (ref 6.0–8.3)
Total Bilirubin: 0.7 mg/dL (ref 0.2–1.2)

## 2016-03-29 LAB — LDL CHOLESTEROL, DIRECT: LDL DIRECT: 95 mg/dL

## 2016-03-29 LAB — VITAMIN D 25 HYDROXY (VIT D DEFICIENCY, FRACTURES): VITD: 31.7 ng/mL (ref 30.00–100.00)

## 2016-03-29 NOTE — Telephone Encounter (Signed)
Orders placed for labs

## 2016-03-30 ENCOUNTER — Encounter: Payer: Self-pay | Admitting: *Deleted

## 2016-03-30 LAB — MICROALBUMIN / CREATININE URINE RATIO
CREATININE, U: 67.7 mg/dL
MICROALB/CREAT RATIO: 1 mg/g (ref 0.0–30.0)
Microalb, Ur: <0.7 mg/dL (ref 0.0–1.9)

## 2016-04-03 ENCOUNTER — Ambulatory Visit: Payer: Medicare Other | Admitting: Internal Medicine

## 2016-04-18 ENCOUNTER — Encounter: Payer: Self-pay | Admitting: Internal Medicine

## 2016-04-18 ENCOUNTER — Ambulatory Visit (INDEPENDENT_AMBULATORY_CARE_PROVIDER_SITE_OTHER): Payer: Medicare Other | Admitting: Internal Medicine

## 2016-04-18 DIAGNOSIS — R109 Unspecified abdominal pain: Secondary | ICD-10-CM

## 2016-04-18 DIAGNOSIS — I10 Essential (primary) hypertension: Secondary | ICD-10-CM | POA: Diagnosis not present

## 2016-04-18 DIAGNOSIS — E119 Type 2 diabetes mellitus without complications: Secondary | ICD-10-CM

## 2016-04-18 DIAGNOSIS — E7800 Pure hypercholesterolemia, unspecified: Secondary | ICD-10-CM

## 2016-04-18 DIAGNOSIS — E669 Obesity, unspecified: Secondary | ICD-10-CM | POA: Diagnosis not present

## 2016-04-18 NOTE — Progress Notes (Signed)
Pre visit review using our clinic review tool, if applicable. No additional management support is needed unless otherwise documented below in the visit note. 

## 2016-04-18 NOTE — Progress Notes (Signed)
Patient ID: Tina Watson, female   DOB: Apr 26, 1928, 82 y.o.   MRN: [id number]   Subjective:    Patient ID: Tina Watson, female    DOB: 09/30/1927, 82 y.o.   MRN: [id number]  HPI  Patient here for a scheduled follow up.  She previously noticed her right foot was swelling.  Previous fracture in the is foot.  No swelling now.  No pain.  She also reports some intermittent thigh pain.  Can occur in both legs.  She was questioning if related to her cholesterol medication.  Had problems previously with crestor.  No chest pain.  No sob.  Does report some left side and RUQ discomfort at times.  Radiates from her back and around to her RUQ.  May be some association with eating.  Discussed further w/up.  She declines.  Bowels stable.  No nausea or vomiting.     Past Medical History:  Diagnosis Date  . Anemia   . Cancer (HCC)    Skin  . Chicken pox   . Colon polyps   . Diabetes mellitus without complication ([place])   . Diverticulosis   . Granuloma annulare   . Hypercholesterolemia   . Hypertension   . Osteoporosis    Past Surgical History:  Procedure Laterality Date  . MOHS SURGERY  07/2014   BCCA on nose  . TONSILLECTOMY  1952  . vaginal hysterectomy with anterior repair  1990   Family History  Problem Relation Age of Onset  . Stroke Father   . Kidney failure Father   . Cancer Mother     question of rectum/vaginal  . Heart disease Brother     x4  . Stroke Sister   . Dementia Sister   . Breast cancer Neg Hx   . Colon cancer Neg Hx    Social History   Social History  . Marital status: Widowed    Spouse name: N/A  . Number of children: 2  . Years of education: N/A   Social History Main Topics  . Smoking status: Never Smoker  . Smokeless tobacco: Never Used  . Alcohol use No  . Drug use: No  . Sexual activity: No   Other Topics Concern  . None   Social History Narrative  . None    Outpatient Encounter Prescriptions as of 04/18/2016  Medication Sig  . Calcium  Carbonate-Vitamin D (CALCIUM 600+D) 600-400 MG-UNIT per tablet Take 1 tablet by mouth daily.  . ferrous sulfate 325 (65 FE) MG tablet TAKE 1 TABLET BY MOUTH DAILY  . fish oil-omega-3 fatty acids 1000 MG capsule Take 1 g by mouth daily.  Marland Kitchen glucose blood (BAYER CONTOUR NEXT TEST) test strip TEST ONCE DAILY  . latanoprost (XALATAN) 0.005 % ophthalmic solution Place 1 drop into both eyes at bedtime.  . Multiple Vitamin (MULTIVITAMIN) tablet Take 1 tablet by mouth daily.  . simvastatin (ZOCOR) 40 MG tablet TAKE 1 TABLET (40 MG TOTAL) BY MOUTH DAILY.  Marland Kitchen telmisartan (MICARDIS) 40 MG tablet TAKE 1 TABLET (40 MG TOTAL) BY MOUTH DAILY. APPT NEEDED AROUND JAUNUARY 2017  . triamterene-hydrochlorothiazide (MAXZIDE-25) 37.5-25 MG tablet TAKE 1/2 TABLET ONCE A DAY  . ammonium lactate (LAC-HYDRIN) 12 % lotion Apply 1 application topically as needed for dry skin. Apply to arms as needed   No facility-administered encounter medications on file as of 04/18/2016.     Review of Systems  Constitutional: Negative for appetite change and unexpected weight change.  HENT: Negative for congestion and  sinus pressure.   Respiratory: Negative for cough, chest tightness and shortness of breath.   Cardiovascular: Negative for chest pain and palpitations.  Gastrointestinal: Positive for abdominal pain. Negative for diarrhea, nausea and vomiting.  Genitourinary: Negative for difficulty urinating and dysuria.  Musculoskeletal:       Thigh pain as outlined.  Intermittent.  No foot pain.   Skin: Negative for color change and rash.  Neurological: Negative for dizziness, light-headedness and headaches.  Psychiatric/Behavioral: Negative for agitation and dysphoric mood.       Objective:    Physical Exam  Constitutional: She appears well-developed and well-nourished. No distress.  HENT:  Nose: Nose normal.  Mouth/Throat: Oropharynx is clear and moist.  Neck: Neck supple. No thyromegaly present.  Cardiovascular: Normal  rate and regular rhythm.   Pulmonary/Chest: Breath sounds normal. No respiratory distress. She has no wheezes.  Abdominal: Soft. Bowel sounds are normal. There is no tenderness.  Musculoskeletal: She exhibits no edema or tenderness.  No pain to palpation over her thighs.    Lymphadenopathy:    She has no cervical adenopathy.  Skin: No rash noted. No erythema.  Psychiatric: She has a normal mood and affect. Her behavior is normal.    BP 122/64   Pulse 80   Temp 97.5 F (36.4 C)   Resp 16   Wt 166 lb (75.3 kg)   LMP 09/13/1984   BMI 29.41 kg/m  Wt Readings from Last 3 Encounters:  04/18/16 166 lb (75.3 kg)  11/30/15 166 lb 4 oz (75.4 kg)  06/02/15 163 lb 12 oz (74.3 kg)     Lab Results  Component Value Date   WBC 8.1 03/29/2016   HGB 13.0 03/29/2016   HCT 38.9 03/29/2016   PLT 259.0 03/29/2016   GLUCOSE 124 (H) 03/29/2016   CHOL 196 03/29/2016   TRIG 344.0 (H) 03/29/2016   HDL 52.70 03/29/2016   LDLDIRECT 95.0 03/29/2016   ALT 21 03/29/2016   AST 20 03/29/2016   NA 139 03/29/2016   K 3.9 03/29/2016   CL 102 03/29/2016   CREATININE 1.15 03/29/2016   BUN 30 (H) 03/29/2016   CO2 28 03/29/2016   TSH 2.96 03/29/2016   HGBA1C 6.7 (H) 03/29/2016   MICROALBUR <0.7 03/29/2016        Assessment & Plan:   Problem List Items Addressed This Visit    Diabetes ([place])    Low carb diet and exercise.  Follow met b and a1c.   Lab Results  Component Value Date   HGBA1C 6.7 (H) 03/29/2016        Relevant Orders   Hemoglobin T4H   Basic metabolic panel   Hypercholesterolemia    On simvastatin.  Concern that the simvastatin may be contributing to the thigh soreness.  Will hold simvastatin.  Call with update over the next two weeks.  Follow.  Low cholesterol diet and exercise.        Relevant Orders   Hepatic function panel   Lipid panel   Hypertension    Blood pressure under good control.  Continue same medication regimen.  Follow pressures.  Follow metabolic panel.          Obesity (BMI 30-39.9)    Diet and exercise.       Side pain    Discussed further w/up today including ultrasound or CT.  She declines.  Wants to follow.         Other Visit Diagnoses   None.  Einar Pheasant, MD

## 2016-04-19 ENCOUNTER — Encounter: Payer: Self-pay | Admitting: Internal Medicine

## 2016-04-19 DIAGNOSIS — R109 Unspecified abdominal pain: Secondary | ICD-10-CM | POA: Insufficient documentation

## 2016-04-19 NOTE — Assessment & Plan Note (Signed)
Blood pressure under good control.  Continue same medication regimen.  Follow pressures.  Follow metabolic panel.   

## 2016-04-19 NOTE — Assessment & Plan Note (Signed)
Diet and exercise.   

## 2016-04-19 NOTE — Assessment & Plan Note (Signed)
Discussed further w/up today including ultrasound or CT.  She declines.  Wants to follow.

## 2016-04-19 NOTE — Assessment & Plan Note (Signed)
On simvastatin.  Concern that the simvastatin may be contributing to the thigh soreness.  Will hold simvastatin.  Call with update over the next two weeks.  Follow.  Low cholesterol diet and exercise.

## 2016-04-19 NOTE — Assessment & Plan Note (Signed)
Low carb diet and exercise.  Follow met b and a1c.   Lab Results  Component Value Date   HGBA1C 6.7 (H) 03/29/2016

## 2016-04-21 ENCOUNTER — Ambulatory Visit: Payer: Medicare Other | Admitting: Internal Medicine

## 2016-05-20 ENCOUNTER — Other Ambulatory Visit: Payer: Self-pay | Admitting: Internal Medicine

## 2016-06-14 DIAGNOSIS — H401131 Primary open-angle glaucoma, bilateral, mild stage: Secondary | ICD-10-CM | POA: Diagnosis not present

## 2016-06-14 LAB — HM DIABETES EYE EXAM

## 2016-06-19 ENCOUNTER — Encounter: Payer: Self-pay | Admitting: Internal Medicine

## 2016-07-03 DIAGNOSIS — Z85828 Personal history of other malignant neoplasm of skin: Secondary | ICD-10-CM | POA: Diagnosis not present

## 2016-07-03 DIAGNOSIS — L821 Other seborrheic keratosis: Secondary | ICD-10-CM | POA: Diagnosis not present

## 2016-07-03 DIAGNOSIS — Z08 Encounter for follow-up examination after completed treatment for malignant neoplasm: Secondary | ICD-10-CM | POA: Diagnosis not present

## 2016-07-03 DIAGNOSIS — Z1283 Encounter for screening for malignant neoplasm of skin: Secondary | ICD-10-CM | POA: Diagnosis not present

## 2016-07-03 DIAGNOSIS — L281 Prurigo nodularis: Secondary | ICD-10-CM | POA: Diagnosis not present

## 2016-07-03 DIAGNOSIS — D1801 Hemangioma of skin and subcutaneous tissue: Secondary | ICD-10-CM | POA: Diagnosis not present

## 2016-08-18 ENCOUNTER — Other Ambulatory Visit (INDEPENDENT_AMBULATORY_CARE_PROVIDER_SITE_OTHER): Payer: Medicare Other

## 2016-08-18 DIAGNOSIS — E7800 Pure hypercholesterolemia, unspecified: Secondary | ICD-10-CM

## 2016-08-18 DIAGNOSIS — E119 Type 2 diabetes mellitus without complications: Secondary | ICD-10-CM

## 2016-08-18 LAB — HEPATIC FUNCTION PANEL
ALK PHOS: 65 U/L (ref 39–117)
ALT: 38 U/L — ABNORMAL HIGH (ref 0–35)
AST: 33 U/L (ref 0–37)
Albumin: 4.2 g/dL (ref 3.5–5.2)
BILIRUBIN DIRECT: 0.1 mg/dL (ref 0.0–0.3)
Total Bilirubin: 0.7 mg/dL (ref 0.2–1.2)
Total Protein: 7 g/dL (ref 6.0–8.3)

## 2016-08-18 LAB — BASIC METABOLIC PANEL
BUN: 27 mg/dL — AB (ref 6–23)
CALCIUM: 9.5 mg/dL (ref 8.4–10.5)
CO2: 32 mEq/L (ref 19–32)
Chloride: 101 mEq/L (ref 96–112)
Creatinine, Ser: 1.01 mg/dL (ref 0.40–1.20)
GFR: 55.12 mL/min — AB (ref >60.00)
Glucose, Bld: 125 mg/dL — ABNORMAL HIGH (ref 70–99)
POTASSIUM: 3.9 meq/L (ref 3.5–5.1)
SODIUM: 141 meq/L (ref 135–145)

## 2016-08-18 LAB — LIPID PANEL
CHOL/HDL RATIO: 6
Cholesterol: 341 mg/dL — ABNORMAL HIGH (ref 0–200)
HDL: 60.7 mg/dL (ref >39.00)
Triglycerides: 433 mg/dL — ABNORMAL HIGH (ref 0.0–149.0)

## 2016-08-18 LAB — HEMOGLOBIN A1C: Hgb A1c MFr Bld: 6.8 % — ABNORMAL HIGH (ref 4.6–6.5)

## 2016-08-18 LAB — LDL CHOLESTEROL, DIRECT: Direct LDL: 196 mg/dL

## 2016-08-21 ENCOUNTER — Ambulatory Visit (INDEPENDENT_AMBULATORY_CARE_PROVIDER_SITE_OTHER): Payer: Medicare Other | Admitting: Internal Medicine

## 2016-08-21 ENCOUNTER — Encounter: Payer: Self-pay | Admitting: Internal Medicine

## 2016-08-21 DIAGNOSIS — E119 Type 2 diabetes mellitus without complications: Secondary | ICD-10-CM

## 2016-08-21 DIAGNOSIS — I10 Essential (primary) hypertension: Secondary | ICD-10-CM

## 2016-08-21 DIAGNOSIS — E669 Obesity, unspecified: Secondary | ICD-10-CM | POA: Diagnosis not present

## 2016-08-21 DIAGNOSIS — E7800 Pure hypercholesterolemia, unspecified: Secondary | ICD-10-CM | POA: Diagnosis not present

## 2016-08-21 DIAGNOSIS — Z23 Encounter for immunization: Secondary | ICD-10-CM | POA: Diagnosis not present

## 2016-08-21 DIAGNOSIS — D649 Anemia, unspecified: Secondary | ICD-10-CM

## 2016-08-21 MED ORDER — PRAVASTATIN SODIUM 10 MG PO TABS: 10.0000 mg | ORAL_TABLET | Freq: Every day | ORAL | 1 refills | Status: DC

## 2016-08-21 NOTE — Progress Notes (Signed)
Patient ID: Tina Watson, female   DOB: [birth year], 82 y.o.   MRN: [id number]   Subjective:    Patient ID: Tina Watson, female    DOB: February 24, 1928, 82 y.o.   MRN: [id number]  HPI  Patient here for a scheduled follow up.  She reports she is doing well.  Stays active.  States she eats a lot of bread.  We discussed her cholesterol labs.  She is off simvastatin.  Her thigh pain and aching is better.  Given elevation, we discussed trying another cholesterol medication.  No chest pain.  No sob.  No acid reflux.  No abdominal pain or cramping.  Bowels stable.     Past Medical History:  Diagnosis Date  . Anemia   . Cancer (HCC)    Skin  . Chicken pox   . Colon polyps   . Diabetes mellitus without complication ([place])   . Diverticulosis   . Granuloma annulare   . Hypercholesterolemia   . Hypertension   . Osteoporosis    Past Surgical History:  Procedure Laterality Date  . MOHS SURGERY  07/2014   BCCA on nose  . TONSILLECTOMY  1952  . vaginal hysterectomy with anterior repair  1990   Family History  Problem Relation Age of Onset  . Stroke Father   . Kidney failure Father   . Cancer Mother     question of rectum/vaginal  . Heart disease Brother     x4  . Stroke Sister   . Dementia Sister   . Breast cancer Neg Hx   . Colon cancer Neg Hx    Social History   Social History  . Marital status: Widowed    Spouse name: N/A  . Number of children: 2  . Years of education: N/A   Social History Main Topics  . Smoking status: Never Smoker  . Smokeless tobacco: Never Used  . Alcohol use No  . Drug use: No  . Sexual activity: No   Other Topics Concern  . None   Social History Narrative  . None    Outpatient Encounter Prescriptions as of 08/21/2016  Medication Sig  . ammonium lactate (LAC-HYDRIN) 12 % lotion Apply 1 application topically as needed for dry skin. Apply to arms as needed  . Calcium Carbonate-Vitamin D (CALCIUM 600+D) 600-400 MG-UNIT per tablet Take 1 tablet by mouth  daily.  . ferrous sulfate 325 (65 FE) MG tablet TAKE 1 TABLET BY MOUTH DAILY  . fish oil-omega-3 fatty acids 1000 MG capsule Take 1 g by mouth daily.  Marland Kitchen glucose blood (BAYER CONTOUR NEXT TEST) test strip TEST ONCE DAILY  . latanoprost (XALATAN) 0.005 % ophthalmic solution Place 1 drop into both eyes at bedtime.  . Multiple Vitamin (MULTIVITAMIN) tablet Take 1 tablet by mouth daily.  Marland Kitchen telmisartan (MICARDIS) 40 MG tablet TAKE 1 TABLET (40 MG TOTAL) BY MOUTH DAILY. APPT NEEDED AROUND JAUNUARY 2017  . triamterene-hydrochlorothiazide (MAXZIDE-25) 37.5-25 MG tablet TAKE 1/2 TABLET ONCE A DAY  . pravastatin (PRAVACHOL) 10 MG tablet Take 1 tablet (10 mg total) by mouth daily.  . [DISCONTINUED] simvastatin (ZOCOR) 40 MG tablet TAKE 1 TABLET (40 MG TOTAL) BY MOUTH DAILY. (Patient not taking: Reported on 08/21/2016)   No facility-administered encounter medications on file as of 08/21/2016.     Review of Systems  Constitutional: Negative for appetite change and unexpected weight change.  HENT: Negative for congestion and sinus pressure.   Respiratory: Negative for cough, chest tightness and shortness of breath.  Cardiovascular: Negative for chest pain, palpitations and leg swelling.  Gastrointestinal: Negative for abdominal pain, diarrhea, nausea and vomiting.  Genitourinary: Negative for difficulty urinating and dysuria.  Musculoskeletal: Negative for back pain.       Aching better since being off cholesterol medication.    Skin: Negative for color change and rash.  Neurological: Negative for dizziness, light-headedness and headaches.  Psychiatric/Behavioral: Negative for agitation and dysphoric mood.       Objective:     Blood pressure rechecked by me:  140/72  Physical Exam  Constitutional: She appears well-developed and well-nourished. No distress.  HENT:  Nose: Nose normal.  Mouth/Throat: Oropharynx is clear and moist.  Neck: Neck supple. No thyromegaly present.  Cardiovascular:  Normal rate and regular rhythm.   Pulmonary/Chest: Breath sounds normal. No respiratory distress. She has no wheezes.  Abdominal: Soft. Bowel sounds are normal. There is no tenderness.  Musculoskeletal: She exhibits no edema or tenderness.  Lymphadenopathy:    She has no cervical adenopathy.  Skin: No rash noted. No erythema.  Psychiatric: She has a normal mood and affect. Her behavior is normal.    BP (!) 160/80   Pulse 77   Wt 164 lb (74.4 kg)   LMP 09/13/1984   SpO2 98%   BMI 29.05 kg/m  Wt Readings from Last 3 Encounters:  08/21/16 164 lb (74.4 kg)  04/18/16 166 lb (75.3 kg)  11/30/15 166 lb 4 oz (75.4 kg)     Lab Results  Component Value Date   WBC 8.1 03/29/2016   HGB 13.0 03/29/2016   HCT 38.9 03/29/2016   PLT 259.0 03/29/2016   GLUCOSE 125 (H) 08/18/2016   CHOL 341 (H) 08/18/2016   TRIG (H) 08/18/2016    433.0 Triglyceride is over 400; calculations on Lipids are invalid.   HDL 60.70 08/18/2016   LDLDIRECT 196.0 08/18/2016   ALT 38 (H) 08/18/2016   AST 33 08/18/2016   NA 141 08/18/2016   K 3.9 08/18/2016   CL 101 08/18/2016   CREATININE 1.01 08/18/2016   BUN 27 (H) 08/18/2016   CO2 32 08/18/2016   TSH 2.96 03/29/2016   HGBA1C 6.8 (H) 08/18/2016   MICROALBUR <0.7 03/29/2016        Assessment & Plan:   Problem List Items Addressed This Visit    Anemia    Previously evaluated by hematology.  Check cbc.        Diabetes ([place])    Low carb diet and exercise.  Follow met b and a1c.   Lab Results  Component Value Date   HGBA1C 6.8 (H) 08/18/2016        Relevant Medications   pravastatin (PRAVACHOL) 10 MG tablet   Hypercholesterolemia    Discussed cholesterol levels.  Low cholesterol diet and exercise.  Follow lipid panel and liver function tests.  Start pravastatin.        Relevant Medications   pravastatin (PRAVACHOL) 10 MG tablet   Hypertension    Blood pressure elevated today.  Better on recheck.  Follow pressures.  Call in readings.  Follow  metabolic panel.        Relevant Medications   pravastatin (PRAVACHOL) 10 MG tablet   Obesity (BMI 30-39.9)    Diet and exercise.  Follow.        Other Visit Diagnoses    Encounter for immunization       Relevant Orders   Flu vaccine HIGH DOSE PF (Completed)       Einar Pheasant, MD

## 2016-08-27 ENCOUNTER — Encounter: Payer: Self-pay | Admitting: Internal Medicine

## 2016-08-27 NOTE — Assessment & Plan Note (Signed)
Previously evaluated by hematology.  Check cbc.

## 2016-08-27 NOTE — Assessment & Plan Note (Signed)
Diet and exercise.  Follow.  

## 2016-08-27 NOTE — Assessment & Plan Note (Signed)
Blood pressure elevated today.  Better on recheck.  Follow pressures.  Call in readings.  Follow metabolic panel.

## 2016-08-27 NOTE — Assessment & Plan Note (Signed)
Low carb diet and exercise.  Follow met b and a1c.   Lab Results  Component Value Date   HGBA1C 6.8 (H) 08/18/2016

## 2016-08-27 NOTE — Assessment & Plan Note (Signed)
Discussed cholesterol levels.  Low cholesterol diet and exercise.  Follow lipid panel and liver function tests.  Start pravastatin.

## 2016-09-12 ENCOUNTER — Other Ambulatory Visit: Payer: Medicare Other

## 2016-09-12 ENCOUNTER — Telehealth: Payer: Self-pay | Admitting: *Deleted

## 2016-09-12 NOTE — Addendum Note (Signed)
Addended by: Leeanne Rio on: 09/12/2016 04:38 PM   Modules accepted: Orders

## 2016-09-12 NOTE — Telephone Encounter (Signed)
Pt came in this morning for labwork to recheck liver panel.    Pt mentioned to me prior to lab draw that she only took one cholesterol pill and stopped due to severe leg cramps. Pt stated that she has a history of Statin intolerance.  I did not draw her labs today since she did not take the medication (after reading her last office note).

## 2016-11-02 ENCOUNTER — Other Ambulatory Visit: Payer: Self-pay | Admitting: Internal Medicine

## 2016-11-15 ENCOUNTER — Other Ambulatory Visit: Payer: Self-pay | Admitting: Internal Medicine

## 2016-11-29 ENCOUNTER — Ambulatory Visit: Payer: Medicare Other

## 2016-11-30 ENCOUNTER — Ambulatory Visit (INDEPENDENT_AMBULATORY_CARE_PROVIDER_SITE_OTHER): Payer: Medicare Other

## 2016-11-30 VITALS — BP 130/70 | HR 84 | Temp 97.6°F | Resp 12 | Ht 63.0 in | Wt 166.4 lb

## 2016-11-30 DIAGNOSIS — Z0000 Encounter for general adult medical examination without abnormal findings: Secondary | ICD-10-CM

## 2016-11-30 NOTE — Progress Notes (Signed)
Subjective:   Tina Watson is a 83 y.o. female who presents for Medicare Annual (Subsequent) preventive examination.  Review of Systems: No ROS.  Medicare Wellness Visit.  Cardiac Risk Factors include: advanced age (>62men, >9 women);diabetes mellitus;hypertension     Objective:     Vitals: BP 130/70 (BP Location: Left Arm, Patient Position: Sitting, Cuff Size: Normal)   Pulse 84   Temp 97.6 F (36.4 C) (Oral)   Resp 12   Ht 5\' 3"  (1.6 m)   Wt 166 lb 6.4 oz (75.5 kg)   LMP 09/13/1984   SpO2 96%   BMI 29.48 kg/m   Body mass index is 29.48 kg/m.   Tobacco History  Smoking Status  . Never Smoker  Smokeless Tobacco  . Never Used     Counseling given: Not Answered   Past Medical History:  Diagnosis Date  . Anemia   . Cancer (HCC)    Skin  . Chicken pox   . Colon polyps   . Diabetes mellitus without complication ([place])   . Diverticulosis   . Granuloma annulare   . Hypercholesterolemia   . Hypertension   . Osteoporosis    Past Surgical History:  Procedure Laterality Date  . MOHS SURGERY  07/2014   BCCA on nose  . TONSILLECTOMY  1952  . vaginal hysterectomy with anterior repair  1990   Family History  Problem Relation Age of Onset  . Stroke Father   . Kidney failure Father   . Cancer Mother     question of rectum/vaginal  . Heart disease Brother     x4  . Stroke Sister   . Dementia Sister   . Breast cancer Neg Hx   . Colon cancer Neg Hx    History  Sexual Activity  . Sexual activity: No    Outpatient Encounter Prescriptions as of 11/30/2016  Medication Sig  . ammonium lactate (LAC-HYDRIN) 12 % lotion Apply 1 application topically as needed for dry skin. Apply to arms as needed  . Calcium Carbonate-Vitamin D (CALCIUM 600+D) 600-400 MG-UNIT per tablet Take 1 tablet by mouth daily.  . ferrous sulfate 325 (65 FE) MG tablet TAKE 1 TABLET BY MOUTH DAILY  . fish oil-omega-3 fatty acids 1000 MG capsule Take 1 g by mouth daily.  Marland Kitchen glucose blood (BAYER  CONTOUR NEXT TEST) test strip TEST ONCE DAILY  . latanoprost (XALATAN) 0.005 % ophthalmic solution Place 1 drop into both eyes at bedtime.  . Multiple Vitamin (MULTIVITAMIN) tablet Take 1 tablet by mouth daily.  Marland Kitchen telmisartan (MICARDIS) 40 MG tablet TAKE 1 TABLET (40 MG TOTAL) BY MOUTH DAILY. APPT NEEDED AROUND JAUNUARY 2017  . triamterene-hydrochlorothiazide (MAXZIDE-25) 37.5-25 MG tablet TAKE 1/2 TABLET ONCE A DAY  . [DISCONTINUED] pravastatin (PRAVACHOL) 10 MG tablet Take 1 tablet (10 mg total) by mouth daily.   No facility-administered encounter medications on file as of 11/30/2016.     Activities of Daily Living In your present state of health, do you have any difficulty performing the following activities: 11/30/2016  Hearing? N  Vision? N  Difficulty concentrating or making decisions? N  Walking or climbing stairs? N  Dressing or bathing? N  Doing errands, shopping? N  Preparing Food and eating ? N  Using the Toilet? N  In the past six months, have you accidently leaked urine? Y  Do you have problems with loss of bowel control? N  Managing your Medications? N  Managing your Finances? N  Housekeeping or managing your  Housekeeping? N  Some recent data might be hidden    Patient Care Team: Einar Pheasant, MD as PCP - General (Internal Medicine)    Assessment:    This is a routine wellness examination for Snyder. The goal of the wellness visit is to assist the patient how to close the gaps in care and create a preventative care plan for the patient.   Taking calcium VIT D as appropriate/Osteoporosis reviewed.  Medications reviewed; taking without issues or barriers.  Safety issues reviewed; lives with daughter. Smoke detectors in the home. No firearms in the home. Wears seatbelts when driving or riding with others. Patient does wear sunscreen or protective clothing when in direct sunlight. No violence in the home.  Patient is alert, normal appearance, oriented to  person/place/and time. Correctly identified the president of the [place], recall of 3/3 words, and performing simple calculations.  Patient displays appropriate judgement and can read correct time from watch face.  No new identified risk were noted.  No failures at ADL's or IADL's.   BMI- discussed the importance of a healthy diet, water intake and exercise. Educational material provided.   Daily fluid intake: 1 cups of caffeine, 4-5 cups of water  HTN- followed by PCP.  Dental- every six months.  [facility].  Eye- Visual acuity not assessed per patient preference since they have regular follow up with the ophthalmologist.  Wears corrective lenses.  Sleep patterns- Sleeps 7 hours at night.  Wakes feeling rested.  Prevnar 13 and TDAP vaccine deferred per patient request.  Educational material provided.  Patient Concerns: None at this time. Follow up with PCP as needed.  Exercise Activities and Dietary recommendations Current Exercise Habits: Home exercise routine, Type of exercise: walking, Time (Minutes): 20, Frequency (Times/Week): 4, Weekly Exercise (Minutes/Week): 80, Intensity: Mild  Goals    . Healthy Lifestyle          Walk one time a week, 30 minutes.  Increase as tolerated. Stay hydrated!  Drink plenty of water. Low carb foods.  Lean meats, fruits and vegetables.       Fall Risk Fall Risk  11/30/2016 11/30/2015 11/30/2015 01/27/2015 03/26/2013  Falls in the past year? No No No No No   Depression Screen PHQ 2/9 Scores 11/30/2016 11/30/2015 11/30/2015 01/27/2015  PHQ - 2 Score 0 0 0 0     Cognitive Function MMSE - Mini Mental State Exam 11/30/2015  Orientation to time 5  Orientation to Place 5  Registration 3  Attention/ Calculation 5  Recall 3  Language- name 2 objects 2  Language- repeat 1  Language- follow 3 step command 3  Language- read & follow direction 1  Write a sentence 1  Copy design 1  Total score 30     6CIT Screen 11/30/2016  What Year? 0  points  What month? 0 points  What time? 0 points  Count back from 20 0 points  Months in reverse 0 points  Repeat phrase 0 points  Total Score 0    Immunization History  Administered Date(s) Administered  . Influenza Split 06/13/2012, 07/04/2012  . Influenza, High Dose Seasonal PF 08/21/2016  . Influenza,inj,Quad PF,36+ Mos 06/25/2013, 06/08/2014, 06/02/2015   Screening Tests Health Maintenance  Topic Date Due  . TETANUS/TDAP  09/11/1948  . DEXA SCAN  09/11/1994  . PNA vac Low Risk Adult (1 of 2 - PCV13) 09/11/1994  . MAMMOGRAM  07/03/2015  . FOOT EXAM  06/01/2016  . HEMOGLOBIN A1C  02/16/2017  . OPHTHALMOLOGY  EXAM  06/14/2017  . INFLUENZA VACCINE  Completed      Plan:    End of life planning; Advance aging; Advanced directives discussed. Copy of current HCPOA/Living Will requested.    Medicare Attestation I have personally reviewed: The patient's medical and social history Their use of alcohol, tobacco or illicit drugs Their current medications and supplements The patient's functional ability including ADLs,fall risks, home safety risks, cognitive, and hearing and visual impairment Diet and physical activities Evidence for depression   The patient's weight, height, BMI, and visual acuity have been recorded in the chart.  I have made referrals and provided education to the patient based on review of the above and I have provided the patient with a written personalized care plan for preventive services.    During the course of the visit the patient was educated and counseled about the following appropriate screening and preventive services:   Vaccines to include Pneumoccal, Influenza, Hepatitis B, Td, Zostavax, HCV  Colorectal cancer screening-UTD  Bone density screening-due, educational material provided  Diabete-followed by PCP  Glaucoma-annual eye exam  Mammography-aged out/UTD  Nutrition counseling   Patient Instructions (the written plan) was given to  the patient.   Varney Biles, LPN  5/63/1497   Reviewed above information.  Agree with plan.  Dr Nicki Reaper

## 2016-11-30 NOTE — Patient Instructions (Addendum)
Tina Watson , Thank you for taking time to come for your Medicare Wellness Visit. I appreciate your ongoing commitment to your health goals. Please review the following plan we discussed and let me know if I can assist you in the future.   Follow up with Dr. Nicki Reaper as needed.    Bring a copy of your [place] and/or Living Will to be scanned into chart.  Have a great day!  These are the goals we discussed: Goals    . Healthy Lifestyle          Walk one time a week, 30 minutes.  Increase as tolerated. Stay hydrated!  Drink plenty of water. Low carb foods.  Lean meats, fruits and vegetables.        This is a list of the screening recommended for you and due dates:  Health Maintenance  Topic Date Due  . Tetanus Vaccine  09/11/1948  . DEXA scan (bone density measurement)  09/11/1994  . Pneumonia vaccines (1 of 2 - PCV13) 09/11/1994  . Mammogram  07/03/2015  . Complete foot exam   06/01/2016  . Hemoglobin A1C  02/16/2017  . Eye exam for diabetics  06/14/2017  . Flu Shot  Completed    Bone Densitometry Bone densitometry is an imaging test that uses a special X-ray to measure the amount of calcium and other minerals in your bones (bone density). This test is also known as a bone mineral density test or dual-energy X-ray absorptiometry (DXA). The test can measure bone density at your hip and your spine. It is similar to having a regular X-ray. You may have this test to:  Diagnose a condition that causes weak or thin bones (osteoporosis).  Predict your risk of a broken bone (fracture).  Determine how well osteoporosis treatment is working. Tell a health care provider about:  Any allergies you have.  All medicines you are taking, including vitamins, herbs, eye drops, creams, and over-the-counter medicines.  Any problems you or family members have had with anesthetic medicines.  Any blood disorders you have.  Any surgeries you have had.  Any medical  conditions you have.  Possibility of pregnancy.  Any other medical test you had within the previous 14 days that used contrast material. What are the risks? Generally, this is a safe procedure. However, problems can occur and may include the following:  This test exposes you to a very small amount of radiation.  The risks of radiation exposure may be greater to unborn children. What happens before the procedure?  Do not take any calcium supplements for 24 hours before having the test. You can otherwise eat and drink what you usually do.  Take off all metal jewelry, eyeglasses, dental appliances, and any other metal objects. What happens during the procedure?  You may lie on an exam table. There will be an X-ray generator below you and an imaging device above you.  Other devices, such as boxes or braces, may be used to position your body properly for the scan.  You will need to lie still while the machine slowly scans your body.  The images will show up on a computer monitor. What happens after the procedure? You may need more testing at a later time. This information is not intended to replace advice given to you by your health care provider. Make sure you discuss any questions you have with your health care provider. Document Released: 09/19/2004 Document Revised: 02/03/2016 Document Reviewed: 02/05/2014 Elsevier Interactive  Patient Education  2017 Elsevier Inc.  

## 2016-12-01 ENCOUNTER — Encounter: Payer: Medicare Other | Admitting: Internal Medicine

## 2016-12-13 DIAGNOSIS — H401131 Primary open-angle glaucoma, bilateral, mild stage: Secondary | ICD-10-CM | POA: Diagnosis not present

## 2016-12-25 ENCOUNTER — Encounter: Payer: Self-pay | Admitting: Internal Medicine

## 2016-12-25 ENCOUNTER — Ambulatory Visit (INDEPENDENT_AMBULATORY_CARE_PROVIDER_SITE_OTHER): Payer: Medicare Other | Admitting: Internal Medicine

## 2016-12-25 VITALS — BP 126/68 | HR 86 | Temp 98.6°F | Resp 12 | Ht 63.0 in | Wt 165.4 lb

## 2016-12-25 DIAGNOSIS — E119 Type 2 diabetes mellitus without complications: Secondary | ICD-10-CM

## 2016-12-25 DIAGNOSIS — Z23 Encounter for immunization: Secondary | ICD-10-CM | POA: Diagnosis not present

## 2016-12-25 DIAGNOSIS — E7800 Pure hypercholesterolemia, unspecified: Secondary | ICD-10-CM | POA: Diagnosis not present

## 2016-12-25 DIAGNOSIS — Z0000 Encounter for general adult medical examination without abnormal findings: Secondary | ICD-10-CM | POA: Diagnosis not present

## 2016-12-25 DIAGNOSIS — I10 Essential (primary) hypertension: Secondary | ICD-10-CM

## 2016-12-25 NOTE — Progress Notes (Signed)
Pre-visit discussion using our clinic review tool. No additional management support is needed unless otherwise documented below in the visit note.  

## 2016-12-25 NOTE — Progress Notes (Signed)
Patient ID: Tina Watson, female   DOB: 02/13/1928, 83 y.o.   MRN: [id number]   Subjective:    Patient ID: Tina Watson, female    DOB: 08-20-1928, 83 y.o.   MRN: [id number]  HPI  Patient here for her physical exam.  States she is doing relatively well.  Stays active.  No chest pain.  No sob.  No acid reflux.  No abdominal pain.  Bowels moving.  Did not tolerate cholesterol medication.  Discussed with her today.  She desires not to try another medication.     Past Medical History:  Diagnosis Date  . Anemia   . Cancer (HCC)    Skin  . Chicken pox   . Colon polyps   . Diabetes mellitus without complication ([place])   . Diverticulosis   . Granuloma annulare   . Hypercholesterolemia   . Hypertension   . Osteoporosis    Past Surgical History:  Procedure Laterality Date  . MOHS SURGERY  07/2014   BCCA on nose  . TONSILLECTOMY  1952  . vaginal hysterectomy with anterior repair  1990   Family History  Problem Relation Age of Onset  . Stroke Father   . Kidney failure Father   . Cancer Mother     question of rectum/vaginal  . Heart disease Brother     x4  . Stroke Sister   . Dementia Sister   . Breast cancer Neg Hx   . Colon cancer Neg Hx    Social History   Social History  . Marital status: Widowed    Spouse name: N/A  . Number of children: 2  . Years of education: N/A   Social History Main Topics  . Smoking status: Never Smoker  . Smokeless tobacco: Never Used  . Alcohol use No  . Drug use: No  . Sexual activity: No   Other Topics Concern  . None   Social History Narrative  . None    Outpatient Encounter Prescriptions as of 12/25/2016  Medication Sig  . ammonium lactate (LAC-HYDRIN) 12 % lotion Apply 1 application topically as needed for dry skin. Apply to arms as needed  . Calcium Carbonate-Vitamin D (CALCIUM 600+D) 600-400 MG-UNIT per tablet Take 1 tablet by mouth daily.  . ferrous sulfate 325 (65 FE) MG tablet TAKE 1 TABLET BY MOUTH DAILY  . fish oil-omega-3  fatty acids 1000 MG capsule Take 1 g by mouth daily.  Marland Kitchen glucose blood (BAYER CONTOUR NEXT TEST) test strip TEST ONCE DAILY  . latanoprost (XALATAN) 0.005 % ophthalmic solution Place 1 drop into both eyes at bedtime.  . Multiple Vitamin (MULTIVITAMIN) tablet Take 1 tablet by mouth daily.  Marland Kitchen telmisartan (MICARDIS) 40 MG tablet TAKE 1 TABLET (40 MG TOTAL) BY MOUTH DAILY. APPT NEEDED AROUND JAUNUARY 2017  . triamterene-hydrochlorothiazide (MAXZIDE-25) 37.5-25 MG tablet TAKE 1/2 TABLET ONCE A DAY   No facility-administered encounter medications on file as of 12/25/2016.     Review of Systems  Constitutional: Negative for appetite change and unexpected weight change.  HENT: Negative for congestion and sinus pressure.   Eyes: Negative for pain and visual disturbance.  Respiratory: Negative for cough, chest tightness and shortness of breath.   Cardiovascular: Negative for chest pain, palpitations and leg swelling.  Gastrointestinal: Negative for abdominal pain, diarrhea, nausea and vomiting.  Genitourinary: Negative for difficulty urinating and dysuria.  Musculoskeletal: Negative for joint swelling and myalgias.  Skin: Negative for color change and rash.  Neurological: Negative for dizziness, light-headedness  and headaches.  Hematological: Negative for adenopathy. Does not bruise/bleed easily.  Psychiatric/Behavioral: Negative for agitation and dysphoric mood.       Objective:    Physical Exam  Constitutional: She is oriented to person, place, and time. She appears well-developed and well-nourished. No distress.  HENT:  Nose: Nose normal.  Mouth/Throat: Oropharynx is clear and moist.  Eyes: Right eye exhibits no discharge. Left eye exhibits no discharge. No scleral icterus.  Neck: Neck supple. No thyromegaly present.  Cardiovascular: Normal rate and regular rhythm.   Pulmonary/Chest: Breath sounds normal. No accessory muscle usage. No tachypnea. No respiratory distress. She has no  decreased breath sounds. She has no wheezes. She has no rhonchi. Right breast exhibits no inverted nipple, no mass, no nipple discharge and no tenderness (no axillary adenopathy). Left breast exhibits no inverted nipple, no mass, no nipple discharge and no tenderness (no axilarry adenopathy).  Abdominal: Soft. Bowel sounds are normal. There is no tenderness.  Musculoskeletal: She exhibits no edema or tenderness.  Lymphadenopathy:    She has no cervical adenopathy.  Neurological: She is alert and oriented to person, place, and time.  Skin: Skin is warm. No rash noted. No erythema.  Psychiatric: She has a normal mood and affect. Her behavior is normal.    BP 126/68 (BP Location: Left Arm, Patient Position: Sitting, Cuff Size: Normal)   Pulse 86   Temp 98.6 F (37 C) (Oral)   Resp 12   Ht '5\' 3"'  (1.6 m)   Wt 165 lb 6.4 oz (75 kg)   LMP 09/13/1984   SpO2 98%   BMI 29.30 kg/m  Wt Readings from Last 3 Encounters:  12/25/16 165 lb 6.4 oz (75 kg)  11/30/16 166 lb 6.4 oz (75.5 kg)  08/21/16 164 lb (74.4 kg)     Lab Results  Component Value Date   WBC 8.1 03/29/2016   HGB 13.0 03/29/2016   HCT 38.9 03/29/2016   PLT 259.0 03/29/2016   GLUCOSE 125 (H) 08/18/2016   CHOL 341 (H) 08/18/2016   TRIG (H) 08/18/2016    433.0 Triglyceride is over 400; calculations on Lipids are invalid.   HDL 60.70 08/18/2016   LDLDIRECT 196.0 08/18/2016   ALT 38 (H) 08/18/2016   AST 33 08/18/2016   NA 141 08/18/2016   K 3.9 08/18/2016   CL 101 08/18/2016   CREATININE 1.01 08/18/2016   BUN 27 (H) 08/18/2016   CO2 32 08/18/2016   TSH 2.96 03/29/2016   HGBA1C 6.8 (H) 08/18/2016   MICROALBUR <0.7 03/29/2016        Assessment & Plan:   Problem List Items Addressed This Visit    Diabetes ([place])    Low carb diet and exercise.  Follow met b and a1c.        Relevant Orders   Hemoglobin A1c   Health care maintenance    Physical today.  She declines mammogram.  Colonoscopy 12/16/13.         Hypercholesterolemia    Discussed cholesterol results.  Discussed treatment.  She did not tolerate cholesterol medication.  Desires not to try another medication.  Low cholesterol diet and exercise.  Follow lipid panel and liver function tests.        Relevant Orders   Lipid panel   Hepatic function panel   Hypertension    Blood pressure under good control.  Continue same medication regimen.  Follow pressures.  Follow metabolic panel.        Relevant Orders  Basic metabolic panel    Other Visit Diagnoses    Need for prophylactic vaccination against Streptococcus pneumoniae (pneumococcus)    -  Primary       Einar Pheasant, MD

## 2016-12-31 ENCOUNTER — Encounter: Payer: Self-pay | Admitting: Internal Medicine

## 2016-12-31 NOTE — Assessment & Plan Note (Signed)
Discussed cholesterol results.  Discussed treatment.  She did not tolerate cholesterol medication.  Desires not to try another medication.  Low cholesterol diet and exercise.  Follow lipid panel and liver function tests.

## 2016-12-31 NOTE — Assessment & Plan Note (Signed)
Physical today.  She declines mammogram.  Colonoscopy 12/16/13.

## 2016-12-31 NOTE — Assessment & Plan Note (Signed)
Blood pressure under good control.  Continue same medication regimen.  Follow pressures.  Follow metabolic panel.   

## 2016-12-31 NOTE — Assessment & Plan Note (Signed)
Low carb diet and exercise.  Follow met b and a1c.   

## 2017-01-30 ENCOUNTER — Other Ambulatory Visit (INDEPENDENT_AMBULATORY_CARE_PROVIDER_SITE_OTHER): Payer: Medicare Other

## 2017-01-30 DIAGNOSIS — I10 Essential (primary) hypertension: Secondary | ICD-10-CM

## 2017-01-30 DIAGNOSIS — E119 Type 2 diabetes mellitus without complications: Secondary | ICD-10-CM | POA: Diagnosis not present

## 2017-01-30 DIAGNOSIS — E7800 Pure hypercholesterolemia, unspecified: Secondary | ICD-10-CM | POA: Diagnosis not present

## 2017-01-30 LAB — HEPATIC FUNCTION PANEL
ALT: 36 U/L — AB (ref 0–35)
AST: 30 U/L (ref 0–37)
Albumin: 4.3 g/dL (ref 3.5–5.2)
Alkaline Phosphatase: 61 U/L (ref 39–117)
BILIRUBIN DIRECT: 0.2 mg/dL (ref 0.0–0.3)
BILIRUBIN TOTAL: 0.7 mg/dL (ref 0.2–1.2)
TOTAL PROTEIN: 7.1 g/dL (ref 6.0–8.3)

## 2017-01-30 LAB — LIPID PANEL
CHOLESTEROL: 304 mg/dL — AB (ref 0–200)
HDL: 50.7 mg/dL (ref >39.00)
NonHDL: 253.65
Total CHOL/HDL Ratio: 6
Triglycerides: 386 mg/dL — ABNORMAL HIGH (ref 0.0–149.0)
VLDL: 77.2 mg/dL — ABNORMAL HIGH (ref 0.0–40.0)

## 2017-01-30 LAB — LDL CHOLESTEROL, DIRECT: Direct LDL: 171 mg/dL

## 2017-01-30 LAB — BASIC METABOLIC PANEL
BUN: 29 mg/dL — ABNORMAL HIGH (ref 6–23)
CHLORIDE: 100 meq/L (ref 96–112)
CO2: 28 mEq/L (ref 19–32)
Calcium: 9.6 mg/dL (ref 8.4–10.5)
Creatinine, Ser: 1.18 mg/dL (ref 0.40–1.20)
GFR: 46.01 mL/min — ABNORMAL LOW (ref >60.00)
Glucose, Bld: 129 mg/dL — ABNORMAL HIGH (ref 70–99)
Potassium: 3.6 mEq/L (ref 3.5–5.1)
Sodium: 138 mEq/L (ref 135–145)

## 2017-01-30 LAB — HEMOGLOBIN A1C: Hgb A1c MFr Bld: 6.9 % — ABNORMAL HIGH (ref 4.6–6.5)

## 2017-02-01 ENCOUNTER — Telehealth: Payer: Self-pay | Admitting: *Deleted

## 2017-02-01 NOTE — Telephone Encounter (Signed)
Patient called back gave all information she did not have any questions.

## 2017-02-01 NOTE — Telephone Encounter (Signed)
Pt called back stating she will be available this afternoon around 2pm.   Call pt @ [phone]. Thank you!

## 2017-02-01 NOTE — Telephone Encounter (Signed)
Patient requested lab results  Pt contact [phone]  Patient questioned if cantaloupe raise blood sugar

## 2017-03-08 ENCOUNTER — Other Ambulatory Visit: Payer: Self-pay | Admitting: Internal Medicine

## 2017-03-12 ENCOUNTER — Telehealth: Payer: Self-pay | Admitting: *Deleted

## 2017-03-12 NOTE — Telephone Encounter (Signed)
Medication Refill requested for : telmisartan  Pharmacy: [facility]  Return Contact : [phone]

## 2017-03-13 ENCOUNTER — Other Ambulatory Visit: Payer: Self-pay

## 2017-03-13 MED ORDER — TELMISARTAN 40 MG PO TABS: ORAL_TABLET | ORAL | 1 refills | Status: DC

## 2017-03-13 NOTE — Telephone Encounter (Signed)
Refill sent in

## 2017-06-11 DIAGNOSIS — H401131 Primary open-angle glaucoma, bilateral, mild stage: Secondary | ICD-10-CM | POA: Diagnosis not present

## 2017-06-18 DIAGNOSIS — H401131 Primary open-angle glaucoma, bilateral, mild stage: Secondary | ICD-10-CM | POA: Diagnosis not present

## 2017-07-03 ENCOUNTER — Encounter: Payer: Self-pay | Admitting: Internal Medicine

## 2017-07-03 ENCOUNTER — Ambulatory Visit (INDEPENDENT_AMBULATORY_CARE_PROVIDER_SITE_OTHER): Payer: Medicare Other | Admitting: Internal Medicine

## 2017-07-03 VITALS — BP 138/74 | HR 76 | Temp 98.1°F | Resp 18 | Wt 160.6 lb

## 2017-07-03 DIAGNOSIS — M79605 Pain in left leg: Secondary | ICD-10-CM | POA: Diagnosis not present

## 2017-07-03 DIAGNOSIS — I10 Essential (primary) hypertension: Secondary | ICD-10-CM | POA: Diagnosis not present

## 2017-07-03 DIAGNOSIS — E119 Type 2 diabetes mellitus without complications: Secondary | ICD-10-CM | POA: Diagnosis not present

## 2017-07-03 DIAGNOSIS — E7800 Pure hypercholesterolemia, unspecified: Secondary | ICD-10-CM | POA: Diagnosis not present

## 2017-07-03 DIAGNOSIS — Z23 Encounter for immunization: Secondary | ICD-10-CM

## 2017-07-03 DIAGNOSIS — M79604 Pain in right leg: Secondary | ICD-10-CM | POA: Diagnosis not present

## 2017-07-03 DIAGNOSIS — M810 Age-related osteoporosis without current pathological fracture: Secondary | ICD-10-CM

## 2017-07-03 LAB — BASIC METABOLIC PANEL
BUN: 28 mg/dL — ABNORMAL HIGH (ref 6–23)
CO2: 32 mEq/L (ref 19–32)
CREATININE: 0.99 mg/dL (ref 0.40–1.20)
Calcium: 9.8 mg/dL (ref 8.4–10.5)
Chloride: 97 mEq/L (ref 96–112)
GFR: 56.29 mL/min — AB (ref >60.00)
GLUCOSE: 118 mg/dL — AB (ref 70–99)
Potassium: 4.2 mEq/L (ref 3.5–5.1)
Sodium: 138 mEq/L (ref 135–145)

## 2017-07-03 LAB — LIPID PANEL
CHOL/HDL RATIO: 6
Cholesterol: 347 mg/dL — ABNORMAL HIGH (ref 0–200)
HDL: 59.2 mg/dL (ref >39.00)
Triglycerides: 443 mg/dL — ABNORMAL HIGH (ref 0.0–149.0)

## 2017-07-03 LAB — HEPATIC FUNCTION PANEL
ALK PHOS: 67 U/L (ref 39–117)
ALT: 35 U/L (ref 0–35)
AST: 27 U/L (ref 0–37)
Albumin: 4.4 g/dL (ref 3.5–5.2)
BILIRUBIN DIRECT: 0.1 mg/dL (ref 0.0–0.3)
BILIRUBIN TOTAL: 0.6 mg/dL (ref 0.2–1.2)
Total Protein: 7.2 g/dL (ref 6.0–8.3)

## 2017-07-03 LAB — VITAMIN D 25 HYDROXY (VIT D DEFICIENCY, FRACTURES): VITD: 29.28 ng/mL — AB (ref 30.00–100.00)

## 2017-07-03 LAB — HEMOGLOBIN A1C: Hgb A1c MFr Bld: 6.8 % — ABNORMAL HIGH (ref 4.6–6.5)

## 2017-07-03 LAB — LDL CHOLESTEROL, DIRECT: LDL DIRECT: 193 mg/dL

## 2017-07-03 NOTE — Progress Notes (Signed)
Patient ID: Tina Watson, female   DOB: 1928/01/10, 83 y.o.   MRN: [id number]   Subjective:    Patient ID: Tina Watson, female    DOB: 11/30/1927, 83 y.o.   MRN: [id number]  HPI  Patient here for a scheduled follow up.  She reports she is doing relatively well.  She has noticed some bilateral upper thigh discomfort.  Started approximately 6 months ago.  Does not limit her walking or activity.  Notices if she stands for a long time.  Some mid back discomfort, but not severe.  No numbness or tingling.  Discussed further evaluation.  She declines.  No chest pain.  No sob.  No significant abdominal pain.  Bowels moving.  Overall she feels she is doing relatively well.  Stays active.  Discussed diet and exercise.     Past Medical History:  Diagnosis Date  . Anemia   . Cancer (HCC)    Skin  . Chicken pox   . Colon polyps   . Diabetes mellitus without complication ([place])   . Diverticulosis   . Granuloma annulare   . Hypercholesterolemia   . Hypertension   . Osteoporosis    Past Surgical History:  Procedure Laterality Date  . MOHS SURGERY  07/2014   BCCA on nose  . TONSILLECTOMY  1952  . vaginal hysterectomy with anterior repair  1990   Family History  Problem Relation Age of Onset  . Stroke Father   . Kidney failure Father   . Cancer Mother        question of rectum/vaginal  . Heart disease Brother        x4  . Stroke Sister   . Dementia Sister   . Breast cancer Neg Hx   . Colon cancer Neg Hx    Social History   Social History  . Marital status: Widowed    Spouse name: N/A  . Number of children: 2  . Years of education: N/A   Social History Main Topics  . Smoking status: Never Smoker  . Smokeless tobacco: Never Used  . Alcohol use No  . Drug use: No  . Sexual activity: No   Other Topics Concern  . None   Social History Narrative  . None    Outpatient Encounter Prescriptions as of 07/03/2017  Medication Sig  . ammonium lactate (LAC-HYDRIN) 12 % lotion Apply 1  application topically as needed for dry skin. Apply to arms as needed  . Calcium Carbonate-Vitamin D (CALCIUM 600+D) 600-400 MG-UNIT per tablet Take 1 tablet by mouth daily.  . ferrous sulfate 325 (65 FE) MG tablet TAKE 1 TABLET BY MOUTH DAILY  . fish oil-omega-3 fatty acids 1000 MG capsule Take 1 g by mouth daily.  Marland Kitchen glucose blood (BAYER CONTOUR NEXT TEST) test strip TEST ONCE DAILY  . latanoprost (XALATAN) 0.005 % ophthalmic solution Place 1 drop into both eyes at bedtime.  . Multiple Vitamin (MULTIVITAMIN) tablet Take 1 tablet by mouth daily.  Marland Kitchen telmisartan (MICARDIS) 40 MG tablet TAKE 1 TABLET (40 MG TOTAL) BY MOUTH DAILY. APPT NEEDED AROUND JAUNUARY 2017  . triamterene-hydrochlorothiazide (MAXZIDE-25) 37.5-25 MG tablet TAKE 1/2 TABLET ONCE A DAY   No facility-administered encounter medications on file as of 07/03/2017.     Review of Systems  Constitutional: Negative for appetite change and unexpected weight change.  HENT: Negative for congestion and sinus pressure.   Respiratory: Negative for cough, chest tightness and shortness of breath.   Cardiovascular: Negative for chest pain,  palpitations and leg swelling.  Gastrointestinal: Negative for abdominal pain, diarrhea, nausea and vomiting.  Genitourinary: Negative for difficulty urinating and dysuria.  Musculoskeletal: Negative for myalgias.       Leg pain as outlined.    Skin: Negative for color change and rash.  Neurological: Negative for dizziness, light-headedness and headaches.  Psychiatric/Behavioral: Negative for agitation and dysphoric mood.       Objective:    Physical Exam  Constitutional: She appears well-developed and well-nourished. No distress.  HENT:  Nose: Nose normal.  Mouth/Throat: Oropharynx is clear and moist.  Neck: Neck supple. No thyromegaly present.  Cardiovascular: Normal rate and regular rhythm.   Pulmonary/Chest: Breath sounds normal. No respiratory distress. She has no wheezes.  Abdominal: Soft.  Bowel sounds are normal. There is no tenderness.  Musculoskeletal: She exhibits no edema or tenderness.  DP pulses palpable and equal bilaterally.  No pain to palpation.  No pain with SLR.    Lymphadenopathy:    She has no cervical adenopathy.  Skin: No rash noted. No erythema.  Psychiatric: She has a normal mood and affect. Her behavior is normal.    BP 138/74   Pulse 76   Temp 98.1 F (36.7 C) (Oral)   Resp 18   Wt 160 lb 9.6 oz (72.8 kg)   LMP 09/13/1984   SpO2 97%   BMI 28.45 kg/m  Wt Readings from Last 3 Encounters:  07/03/17 160 lb 9.6 oz (72.8 kg)  12/25/16 165 lb 6.4 oz (75 kg)  11/30/16 166 lb 6.4 oz (75.5 kg)     Lab Results  Component Value Date   WBC 8.1 03/29/2016   HGB 13.0 03/29/2016   HCT 38.9 03/29/2016   PLT 259.0 03/29/2016   GLUCOSE 118 (H) 07/03/2017   CHOL 347 (H) 07/03/2017   TRIG (H) 07/03/2017    443.0 Triglyceride is over 400; calculations on Lipids are invalid.   HDL 59.20 07/03/2017   LDLDIRECT 193.0 07/03/2017   ALT 35 07/03/2017   AST 27 07/03/2017   NA 138 07/03/2017   K 4.2 07/03/2017   CL 97 07/03/2017   CREATININE 0.99 07/03/2017   BUN 28 (H) 07/03/2017   CO2 32 07/03/2017   TSH 2.96 03/29/2016   HGBA1C 6.8 (H) 07/03/2017   MICROALBUR <0.7 03/29/2016       Assessment & Plan:   Problem List Items Addressed This Visit    Diabetes ([place])    Low carb diet and exercise.  Discussed diet and exercise with her.  Follow metb and a1c.        Relevant Orders   Hemoglobin A1c (Completed)   Hypercholesterolemia    She has not tolerated statin medication previously.  Desires not to take cholesterol medication.  Discussed diet and exercise.  Follow lipid panel.        Relevant Orders   Lipid panel (Completed)   Hepatic function panel (Completed)   Hypertension - Primary    Blood pressure on recheck improved.  Continue current medication regimen.  Follow pressures.  Follow metabolic panel.        Relevant Orders   Basic  metabolic panel (Completed)   Leg pain    Pain in upper thighs as outlined.  Not limiting her activity.  No focal abnormality noted on exam.  Discussed further w/up and evaluation.  She declines.  Follow.        Osteoporosis   Relevant Orders   VITAMIN D 25 Hydroxy (Vit-D Deficiency, Fractures) (Completed)  Other Visit Diagnoses    Encounter for immunization       Relevant Orders   Flu vaccine HIGH DOSE PF (Completed)       Einar Pheasant, MD

## 2017-07-06 ENCOUNTER — Encounter: Payer: Self-pay | Admitting: Internal Medicine

## 2017-07-06 DIAGNOSIS — M79606 Pain in leg, unspecified: Secondary | ICD-10-CM | POA: Insufficient documentation

## 2017-07-06 NOTE — Assessment & Plan Note (Signed)
Low carb diet and exercise.  Discussed diet and exercise with her.  Follow metb and a1c.

## 2017-07-06 NOTE — Assessment & Plan Note (Signed)
Blood pressure on recheck improved.  Continue current medication regimen.  Follow pressures.  Follow metabolic panel.   

## 2017-07-06 NOTE — Assessment & Plan Note (Signed)
She has not tolerated statin medication previously.  Desires not to take cholesterol medication.  Discussed diet and exercise.  Follow lipid panel.

## 2017-07-06 NOTE — Assessment & Plan Note (Signed)
Pain in upper thighs as outlined.  Not limiting her activity.  No focal abnormality noted on exam.  Discussed further w/up and evaluation.  She declines.  Follow.

## 2017-07-09 ENCOUNTER — Other Ambulatory Visit: Payer: Self-pay | Admitting: Internal Medicine

## 2017-07-09 DIAGNOSIS — L2089 Other atopic dermatitis: Secondary | ICD-10-CM | POA: Diagnosis not present

## 2017-07-09 DIAGNOSIS — Z85828 Personal history of other malignant neoplasm of skin: Secondary | ICD-10-CM | POA: Diagnosis not present

## 2017-07-09 DIAGNOSIS — L578 Other skin changes due to chronic exposure to nonionizing radiation: Secondary | ICD-10-CM | POA: Diagnosis not present

## 2017-07-09 DIAGNOSIS — L720 Epidermal cyst: Secondary | ICD-10-CM | POA: Diagnosis not present

## 2017-11-08 ENCOUNTER — Telehealth: Payer: Self-pay | Admitting: Internal Medicine

## 2017-11-08 NOTE — Telephone Encounter (Signed)
Please advise 

## 2017-11-08 NOTE — Telephone Encounter (Signed)
Copied from [place]. Topic: Quick Communication - See Telephone Encounter >> Nov 08, 2017  9:47 AM Robina Ade, Helene Kelp D wrote: CRM for notification. See Telephone encounter for: 11/08/17. Patient called and would like to talk to Dr. Nicki Reaper CMA about her meter and her test strips. She wants to know if she can have a new machine and test strips. She is getting an error message. Patient will not be home after 12pm due to she has to go to a funeral service. Please call patient back, thanks.

## 2017-11-08 NOTE — Telephone Encounter (Signed)
LMTCB

## 2017-11-10 ENCOUNTER — Other Ambulatory Visit: Payer: Self-pay | Admitting: Internal Medicine

## 2017-12-03 ENCOUNTER — Ambulatory Visit (INDEPENDENT_AMBULATORY_CARE_PROVIDER_SITE_OTHER): Payer: Medicare Other

## 2017-12-03 ENCOUNTER — Other Ambulatory Visit: Payer: Self-pay

## 2017-12-03 VITALS — BP 124/64 | HR 82 | Temp 98.0°F | Resp 14 | Ht 63.0 in | Wt 162.0 lb

## 2017-12-03 DIAGNOSIS — Z0000 Encounter for general adult medical examination without abnormal findings: Secondary | ICD-10-CM | POA: Diagnosis not present

## 2017-12-03 MED ORDER — GLUCOSE BLOOD VI STRP: ORAL_STRIP | 2 refills | Status: AC

## 2017-12-03 NOTE — Patient Instructions (Addendum)
  Ms. Ruegg , Thank you for taking time to come for your Medicare Wellness Visit. I appreciate your ongoing commitment to your health goals. Please review the following plan we discussed and let me know if I can assist you in the future.   Follow up with Dr. Nicki Reaper as needed.    Bring a copy of your [place] and/or Living Will to be scanned into chart.  Have a great day!  These are the goals we discussed: Goals    . Healthy Lifestyle     Walk for exercise  Stay hydrated!  Drink plenty of water Low carb/low cholesterol foods        This is a list of the screening recommended for you and due dates:  Health Maintenance  Topic Date Due  . Tetanus Vaccine  09/11/1948  . DEXA scan (bone density measurement)  09/11/1994  . Complete foot exam   06/01/2016  . Eye exam for diabetics  06/14/2017  . Mammogram  12/25/2036*  . Pneumonia vaccines (2 of 2 - PPSV23) 12/25/2017  . Hemoglobin A1C  01/01/2018  . Flu Shot  Completed  *Topic was postponed. The date shown is not the original due date.

## 2017-12-03 NOTE — Progress Notes (Signed)
Subjective:   Tina Watson is a 84 y.o. female who presents for Medicare Annual (Subsequent) preventive examination.  Review of Systems:  No ROS.  Medicare Wellness Visit. Additional risk factors are reflected in the social history. Cardiac Risk Factors include: advanced age (>11men, >60 women);hypertension;diabetes mellitus     Objective:     Vitals: BP 124/64 (BP Location: Left Arm, Patient Position: Sitting, Cuff Size: Normal)   Pulse 82   Temp 98 F (36.7 C) (Oral)   Resp 14   Ht 5\' 3"  (1.6 m)   Wt 162 lb (73.5 kg)   LMP 09/13/1984   SpO2 98%   BMI 28.70 kg/m   Body mass index is 28.7 kg/m.  Advanced Directives 12/03/2017 11/30/2016 11/30/2015  Does Patient Have a Medical Advance Directive? Yes Yes Yes  Type of Paramedic of [place];Living will [place];Living will  Does patient want to make changes to medical advance directive? No - Patient declined No - Patient declined -  Copy of [place] in Chart? No - copy requested No - copy requested No - copy requested    Tobacco Social History   Tobacco Use  Smoking Status Never Smoker  Smokeless Tobacco Never Used     Counseling given: Not Answered   Clinical Intake:  Pre-visit preparation completed: Yes  Pain : No/denies pain     Nutritional Status: BMI 25 -29 Overweight Diabetes: Yes(Followed by PCP)  How often do you need to have someone help you when you read instructions, pamphlets, or other written materials from your doctor or pharmacy?: 1 - Never  Interpreter Needed?: No     Past Medical History:  Diagnosis Date  . Anemia   . Cancer (HCC)    Skin  . Chicken pox   . Colon polyps   . Diabetes mellitus without complication ([place])   . Diverticulosis   . Granuloma annulare   . Hypercholesterolemia   . Hypertension   . Osteoporosis    Past Surgical History:  Procedure Laterality Date  . MOHS SURGERY   07/2014   BCCA on nose  . TONSILLECTOMY  1952  . vaginal hysterectomy with anterior repair  1990   Family History  Problem Relation Age of Onset  . Stroke Father   . Kidney failure Father   . Cancer Mother        question of rectum/vaginal  . Heart disease Brother        x4  . Stroke Sister   . Dementia Sister   . Pneumonia Sister   . Breast cancer Neg Hx   . Colon cancer Neg Hx    Social History   Socioeconomic History  . Marital status: Widowed    Spouse name: Not on file  . Number of children: 2  . Years of education: Not on file  . Highest education level: Not on file  Occupational History  . Not on file  Social Needs  . Financial resource strain: Not hard at all  . Food insecurity:    Worry: Never true    Inability: Never true  . Transportation needs:    Medical: No    Non-medical: No  Tobacco Use  . Smoking status: Never Smoker  . Smokeless tobacco: Never Used  Substance and Sexual Activity  . Alcohol use: No    Alcohol/week: 0.0 oz  . Drug use: No  . Sexual activity: Never  Lifestyle  . Physical  activity:    Days per week: Not on file    Minutes per session: Not on file  . Stress: Not at all  Relationships  . Social connections:    Talks on phone: Not on file    Gets together: Not on file    Attends religious service: Not on file    Active member of club or organization: Not on file    Attends meetings of clubs or organizations: Not on file    Relationship status: Not on file  Other Topics Concern  . Not on file  Social History Narrative  . Not on file    Outpatient Encounter Medications as of 12/03/2017  Medication Sig  . ammonium lactate (LAC-HYDRIN) 12 % lotion Apply 1 application topically as needed for dry skin. Apply to arms as needed  . Calcium Carbonate-Vitamin D (CALCIUM 600+D) 600-400 MG-UNIT per tablet Take 1 tablet by mouth daily.  . ferrous sulfate 325 (65 FE) MG tablet TAKE 1 TABLET BY MOUTH DAILY  . fish oil-omega-3 fatty acids  1000 MG capsule Take 1 g by mouth daily.  Marland Kitchen glucose blood (BAYER CONTOUR NEXT TEST) test strip TEST ONCE DAILY  . latanoprost (XALATAN) 0.005 % ophthalmic solution Place 1 drop into both eyes at bedtime.  . Multiple Vitamin (MULTIVITAMIN) tablet Take 1 tablet by mouth daily.  Marland Kitchen telmisartan (MICARDIS) 40 MG tablet TAKE 1 TABLET (40 MG TOTAL) BY MOUTH DAILY. APPT NEEDED AROUND JAUNUARY 2017  . triamterene-hydrochlorothiazide (MAXZIDE-25) 37.5-25 MG tablet TAKE 1/2 TABLET ONCE A DAY   No facility-administered encounter medications on file as of 12/03/2017.     Activities of Daily Living In your present state of health, do you have any difficulty performing the following activities: 12/03/2017  Hearing? Y  Comment Hearing aid  Vision? N  Difficulty concentrating or making decisions? Y  Walking or climbing stairs? N  Dressing or bathing? N  Doing errands, shopping? N  Preparing Food and eating ? N  Using the Toilet? N  In the past six months, have you accidently leaked urine? N  Comment Prolapsed bladder. Wears daily liner.  Followed by PCP.  Do you have problems with loss of bowel control? N  Managing your Medications? N  Managing your Finances? N  Housekeeping or managing your Housekeeping? N  Some recent data might be hidden    Patient Care Team: Einar Pheasant, MD as PCP - General (Internal Medicine)    Assessment:   This is a routine wellness examination for [place]. The goal of the wellness visit is to assist the patient how to close the gaps in care and create a preventative care plan for the patient.   The roster of all physicians providing medical care to patient is listed in the Snapshot section of the chart.  Taking calcium VIT D as appropriate/Osteoporosis reviewed.    Safety issues reviewed; Lives with daughter.  Smoke and carbon monoxide detectors in the home. No firearms in the home. Wears seatbelts when driving or riding with others. No violence in the  home.  They do not have excessive sun exposure.  Discussed the need for sun protection: hats, long sleeves and the use of sunscreen if there is significant sun exposure.  Patient is alert, normal appearance, oriented to person/place/and time.  Correctly identified the president of the [place] and recalls of 2/3 words. Performs simple calculations and can read correct time from watch face. Displays appropriate judgement.  No new identified risk were noted.  No failures  at ADL's or IADL's.    BMI- discussed the importance of a healthy diet, water intake and the benefits of aerobic exercise. Educational material provided.   24 hour diet recall: Low carb diet  Dental- every 6 months.  Eye- Visual acuity not assessed per patient preference since they have regular follow up with the ophthalmologist.  Wears corrective lenses.  Sleep patterns- Sleeps 6-7 hours at night.  Naps during the day.    Dexa Scan discussed. Educational material provided.   TDAP vaccine deferred per patient preference.  Follow up with insurance.  Educational material provided.  Patient Concerns: None at this time. Follow up with PCP as needed.  Exercise Activities and Dietary recommendations Current Exercise Habits: The patient does not participate in regular exercise at present  Goals    . Healthy Lifestyle     Walk for exercise  Stay hydrated!  Drink plenty of water Low carb/low cholesterol foods        Fall Risk Fall Risk  12/03/2017 11/30/2016 11/30/2015 11/30/2015 01/27/2015  Falls in the past year? No No No No No   Depression Screen PHQ 2/9 Scores 12/03/2017 11/30/2016 11/30/2015 11/30/2015  PHQ - 2 Score 0 0 0 0     Cognitive Function MMSE - Mini Mental State Exam 11/30/2015  Orientation to time 5  Orientation to Place 5  Registration 3  Attention/ Calculation 5  Recall 3  Language- name 2 objects 2  Language- repeat 1  Language- follow 3 step command 3  Language- read & follow direction 1   Write a sentence 1  Copy design 1  Total score 30     6CIT Screen 12/03/2017 11/30/2016  What Year? 0 points 0 points  What month? 0 points 0 points  What time? 0 points 0 points  Count back from 20 0 points 0 points  Months in reverse 0 points 0 points  Repeat phrase - 0 points  Total Score - 0    Immunization History  Administered Date(s) Administered  . Influenza Split 06/13/2012, 07/04/2012  . Influenza, High Dose Seasonal PF 08/21/2016, 07/03/2017  . Influenza,inj,Quad PF,6+ Mos 06/25/2013, 06/08/2014, 06/02/2015  . Pneumococcal Conjugate-13 12/25/2016    Screening Tests Health Maintenance  Topic Date Due  . TETANUS/TDAP  09/11/1948  . DEXA SCAN  09/11/1994  . FOOT EXAM  06/01/2016  . OPHTHALMOLOGY EXAM  06/14/2017  . MAMMOGRAM  12/25/2036 (Originally 07/03/2015)  . PNA vac Low Risk Adult (2 of 2 - PPSV23) 12/25/2017  . HEMOGLOBIN A1C  01/01/2018  . INFLUENZA VACCINE  Completed      Plan:    End of life planning; Advance aging; Advanced directives discussed. Copy of current HCPOA/Living Will requested.    I have personally reviewed and noted the following in the patient's chart:   . Medical and social history . Use of alcohol, tobacco or illicit drugs  . Current medications and supplements . Functional ability and status . Nutritional status . Physical activity . Advanced directives . List of other physicians . Hospitalizations, surgeries, and ER visits in previous 12 months . Vitals . Screenings to include cognitive, depression, and falls . Referrals and appointments  In addition, I have reviewed and discussed with patient certain preventive protocols, quality metrics, and best practice recommendations. A written personalized care plan for preventive services as well as general preventive health recommendations were provided to patient.     Varney Biles, LPN  [id number]

## 2017-12-10 ENCOUNTER — Other Ambulatory Visit: Payer: Self-pay | Admitting: Internal Medicine

## 2017-12-17 DIAGNOSIS — H401131 Primary open-angle glaucoma, bilateral, mild stage: Secondary | ICD-10-CM | POA: Diagnosis not present

## 2018-01-01 ENCOUNTER — Ambulatory Visit (INDEPENDENT_AMBULATORY_CARE_PROVIDER_SITE_OTHER): Payer: Medicare Other | Admitting: Internal Medicine

## 2018-01-01 ENCOUNTER — Encounter: Payer: Self-pay | Admitting: Internal Medicine

## 2018-01-01 VITALS — BP 128/74 | HR 71 | Temp 97.7°F | Resp 16 | Wt 159.2 lb

## 2018-01-01 DIAGNOSIS — E7800 Pure hypercholesterolemia, unspecified: Secondary | ICD-10-CM | POA: Diagnosis not present

## 2018-01-01 DIAGNOSIS — Z23 Encounter for immunization: Secondary | ICD-10-CM | POA: Diagnosis not present

## 2018-01-01 DIAGNOSIS — E119 Type 2 diabetes mellitus without complications: Secondary | ICD-10-CM | POA: Diagnosis not present

## 2018-01-01 DIAGNOSIS — I10 Essential (primary) hypertension: Secondary | ICD-10-CM

## 2018-01-01 DIAGNOSIS — D649 Anemia, unspecified: Secondary | ICD-10-CM | POA: Diagnosis not present

## 2018-01-01 LAB — BASIC METABOLIC PANEL
BUN: 24 mg/dL — ABNORMAL HIGH (ref 6–23)
CALCIUM: 9.7 mg/dL (ref 8.4–10.5)
CO2: 31 meq/L (ref 19–32)
CREATININE: 1.02 mg/dL (ref 0.40–1.20)
Chloride: 99 mEq/L (ref 96–112)
GFR: 54.32 mL/min — ABNORMAL LOW (ref >60.00)
GLUCOSE: 103 mg/dL — AB (ref 70–99)
Potassium: 3.7 mEq/L (ref 3.5–5.1)
Sodium: 138 mEq/L (ref 135–145)

## 2018-01-01 LAB — HEPATIC FUNCTION PANEL
ALBUMIN: 4.2 g/dL (ref 3.5–5.2)
ALT: 28 U/L (ref 0–35)
AST: 23 U/L (ref 0–37)
Alkaline Phosphatase: 68 U/L (ref 39–117)
Bilirubin, Direct: 0.1 mg/dL (ref 0.0–0.3)
Total Bilirubin: 0.6 mg/dL (ref 0.2–1.2)
Total Protein: 7.2 g/dL (ref 6.0–8.3)

## 2018-01-01 LAB — TSH: TSH: 2.6 u[IU]/mL (ref 0.35–4.50)

## 2018-01-01 LAB — CBC WITH DIFFERENTIAL/PLATELET
BASOS ABS: 0 10*3/uL (ref 0.0–0.1)
BASOS PCT: 0.5 % (ref 0.0–3.0)
EOS ABS: 0.2 10*3/uL (ref 0.0–0.7)
Eosinophils Relative: 2.5 % (ref 0.0–5.0)
HEMATOCRIT: 41.4 % (ref 36.0–46.0)
Hemoglobin: 14.2 g/dL (ref 12.0–15.0)
LYMPHS ABS: 3.5 10*3/uL (ref 0.7–4.0)
LYMPHS PCT: 42.3 % (ref 12.0–46.0)
MCHC: 34.2 g/dL (ref 30.0–36.0)
MCV: 88.7 fl (ref 78.0–100.0)
MONO ABS: 0.8 10*3/uL (ref 0.1–1.0)
Monocytes Relative: 9 % (ref 3.0–12.0)
NEUTROS ABS: 3.8 10*3/uL (ref 1.4–7.7)
NEUTROS PCT: 45.7 % (ref 43.0–77.0)
PLATELETS: 258 10*3/uL (ref 150.0–400.0)
RBC: 4.67 Mil/uL (ref 3.87–5.11)
RDW: 13.4 % (ref 11.5–15.5)
WBC: 8.3 10*3/uL (ref 4.0–10.5)

## 2018-01-01 LAB — LIPID PANEL
CHOL/HDL RATIO: 5
CHOLESTEROL: 319 mg/dL — AB (ref 0–200)
HDL: 59 mg/dL (ref >39.00)
Triglycerides: 409 mg/dL — ABNORMAL HIGH (ref 0.0–149.0)

## 2018-01-01 LAB — MICROALBUMIN / CREATININE URINE RATIO
Creatinine,U: 47.1 mg/dL
Microalb Creat Ratio: 1.5 mg/g (ref 0.0–30.0)
Microalb, Ur: <0.7 mg/dL (ref 0.0–1.9)

## 2018-01-01 LAB — LDL CHOLESTEROL, DIRECT: Direct LDL: 189 mg/dL

## 2018-01-01 LAB — HEMOGLOBIN A1C: HEMOGLOBIN A1C: 6.7 % — AB (ref 4.6–6.5)

## 2018-01-01 NOTE — Progress Notes (Signed)
Patient ID: Tina Watson, female   DOB: [birth year], 84 y.o.   MRN: [id number]   Subjective:    Patient ID: Tina Watson, female    DOB: [birth year], 84 y.o.   MRN: [id number]  HPI  Patient here for a scheduled follow up.  States she is doing well.  Feels good.  Tries to stay active.  No chest pain.  No sob.  No acid reflux.  No abdominal pain.  Bowels moving.  No pain in her thighs.  She was having problems last visit.  See note.  She stopped her cholesterol medication.  Feels better.  Blood sugars averaging 114-127.  States blood pressures ok on her checks. Overall she feels good.     Past Medical History:  Diagnosis Date  . Anemia   . Cancer (HCC)    Skin  . Chicken pox   . Colon polyps   . Diabetes mellitus without complication ([place])   . Diverticulosis   . Granuloma annulare   . Hypercholesterolemia   . Hypertension   . Osteoporosis    Past Surgical History:  Procedure Laterality Date  . MOHS SURGERY  07/2014   BCCA on nose  . TONSILLECTOMY  1952  . vaginal hysterectomy with anterior repair  1990   Family History  Problem Relation Age of Onset  . Stroke Father   . Kidney failure Father   . Cancer Mother        question of rectum/vaginal  . Heart disease Brother        x4  . Stroke Sister   . Dementia Sister   . Pneumonia Sister   . Breast cancer Neg Hx   . Colon cancer Neg Hx    Social History   Socioeconomic History  . Marital status: Widowed    Spouse name: Not on file  . Number of children: 2  . Years of education: Not on file  . Highest education level: Not on file  Occupational History  . Not on file  Social Needs  . Financial resource strain: Not hard at all  . Food insecurity:    Worry: Never true    Inability: Never true  . Transportation needs:    Medical: No    Non-medical: No  Tobacco Use  . Smoking status: Never Smoker  . Smokeless tobacco: Never Used  Substance and Sexual Activity  . Alcohol use: No    Alcohol/week: 0.0 oz  . Drug use: No    . Sexual activity: Never  Lifestyle  . Physical activity:    Days per week: Not on file    Minutes per session: Not on file  . Stress: Not at all  Relationships  . Social connections:    Talks on phone: Not on file    Gets together: Not on file    Attends religious service: Not on file    Active member of club or organization: Not on file    Attends meetings of clubs or organizations: Not on file    Relationship status: Not on file  Other Topics Concern  . Not on file  Social History Narrative  . Not on file    Outpatient Encounter Medications as of 01/01/2018  Medication Sig  . ammonium lactate (LAC-HYDRIN) 12 % lotion Apply 1 application topically as needed for dry skin. Apply to arms as needed  . Calcium Carbonate-Vitamin D (CALCIUM 600+D) 600-400 MG-UNIT per tablet Take 1 tablet by mouth daily.  . ferrous sulfate 325 (65  FE) MG tablet TAKE 1 TABLET BY MOUTH DAILY  . fish oil-omega-3 fatty acids 1000 MG capsule Take 1 g by mouth daily.  Marland Kitchen glucose blood (BAYER CONTOUR NEXT TEST) test strip TEST ONCE DAILY  . latanoprost (XALATAN) 0.005 % ophthalmic solution Place 1 drop into both eyes at bedtime.  . Multiple Vitamin (MULTIVITAMIN) tablet Take 1 tablet by mouth daily.  Marland Kitchen telmisartan (MICARDIS) 40 MG tablet TAKE 1 TABLET (40 MG TOTAL) BY MOUTH DAILY.  Marland Kitchen triamterene-hydrochlorothiazide (MAXZIDE-25) 37.5-25 MG tablet TAKE 1/2 TABLET ONCE A DAY   No facility-administered encounter medications on file as of 01/01/2018.     Review of Systems  Constitutional: Negative for appetite change and unexpected weight change.  HENT: Negative for congestion and sinus pressure.   Respiratory: Negative for cough, chest tightness and shortness of breath.   Cardiovascular: Negative for chest pain, palpitations and leg swelling.  Gastrointestinal: Negative for abdominal pain, diarrhea, nausea and vomiting.  Genitourinary: Negative for difficulty urinating and dysuria.  Musculoskeletal: Negative  for joint swelling and myalgias.  Skin: Negative for color change and rash.  Neurological: Negative for dizziness, light-headedness and headaches.  Psychiatric/Behavioral: Negative for agitation and dysphoric mood.       Objective:    Physical Exam  Constitutional: She appears well-developed and well-nourished. No distress.  HENT:  Nose: Nose normal.  Mouth/Throat: Oropharynx is clear and moist.  Neck: Neck supple. No thyromegaly present.  Cardiovascular: Normal rate and regular rhythm.  Pulmonary/Chest: Breath sounds normal. No respiratory distress. She has no wheezes.  Abdominal: Soft. Bowel sounds are normal. There is no tenderness.  Musculoskeletal: She exhibits no edema or tenderness.  Lymphadenopathy:    She has no cervical adenopathy.  Skin: No rash noted. No erythema.  Psychiatric: She has a normal mood and affect. Her behavior is normal.    BP 128/74 (BP Location: Left Arm, Patient Position: Sitting, Cuff Size: Normal)   Pulse 71   Temp 97.7 F (36.5 C) (Oral)   Resp 16   Wt 159 lb 3.2 oz (72.2 kg)   LMP 09/13/1984   BMI 28.20 kg/m  Wt Readings from Last 3 Encounters:  01/01/18 159 lb 3.2 oz (72.2 kg)  12/03/17 162 lb (73.5 kg)  07/03/17 160 lb 9.6 oz (72.8 kg)     Lab Results  Component Value Date   WBC 8.3 01/01/2018   HGB 14.2 01/01/2018   HCT 41.4 01/01/2018   PLT 258.0 01/01/2018   GLUCOSE 103 (H) 01/01/2018   CHOL 319 (H) 01/01/2018   TRIG (H) 01/01/2018    409.0 Triglyceride is over 400; calculations on Lipids are invalid.   HDL 59.00 01/01/2018   LDLDIRECT 189.0 01/01/2018   ALT 28 01/01/2018   AST 23 01/01/2018   NA 138 01/01/2018   K 3.7 01/01/2018   CL 99 01/01/2018   CREATININE 1.02 01/01/2018   BUN 24 (H) 01/01/2018   CO2 31 01/01/2018   TSH 2.60 01/01/2018   HGBA1C 6.7 (H) 01/01/2018   MICROALBUR <0.7 01/01/2018       Assessment & Plan:   Problem List Items Addressed This Visit    Anemia - Primary    Previously evaluated by  hematology.  Follow cbc.        Relevant Orders   CBC with Differential/Platelet (Completed)   Diabetes ([place])    Low carb diet and exercise.  Follow met b and a1c.        Relevant Orders   Hemoglobin A1c (Completed)  Basic metabolic panel (Completed)   Microalbumin / creatinine urine ratio (Completed)   Hypercholesterolemia    Had intolerance to cholesterol medication.  Feels better off.  Low cholesterol diet and exercise.  Follow lipid panel.        Relevant Orders   Hepatic function panel (Completed)   Lipid panel (Completed)   Hypertension    Blood pressure has been well controlled.  Follow.  Continue current medication regimen.        Relevant Orders   TSH (Completed)    Other Visit Diagnoses    Need for 23-polyvalent pneumococcal polysaccharide vaccine       Relevant Orders   Pneumococcal polysaccharide vaccine 23-valent greater than or equal to 2yo subcutaneous/IM (Completed)       Einar Pheasant, MD

## 2018-01-05 ENCOUNTER — Encounter: Payer: Self-pay | Admitting: Internal Medicine

## 2018-01-05 NOTE — Assessment & Plan Note (Signed)
Low carb diet and exercise.  Follow met b and a1c.   

## 2018-01-05 NOTE — Assessment & Plan Note (Signed)
Blood pressure has been well controlled.  Follow.  Continue current medication regimen.

## 2018-01-05 NOTE — Assessment & Plan Note (Signed)
Had intolerance to cholesterol medication.  Feels better off.  Low cholesterol diet and exercise.  Follow lipid panel.

## 2018-01-05 NOTE — Assessment & Plan Note (Signed)
Previously evaluated by hematology.  Follow cbc.  

## 2018-03-09 ENCOUNTER — Other Ambulatory Visit: Payer: Self-pay | Admitting: Internal Medicine

## 2018-06-10 ENCOUNTER — Other Ambulatory Visit: Payer: Self-pay | Admitting: Internal Medicine

## 2018-06-26 DIAGNOSIS — H401131 Primary open-angle glaucoma, bilateral, mild stage: Secondary | ICD-10-CM | POA: Diagnosis not present

## 2018-07-01 DIAGNOSIS — H903 Sensorineural hearing loss, bilateral: Secondary | ICD-10-CM | POA: Diagnosis not present

## 2018-07-03 DIAGNOSIS — E119 Type 2 diabetes mellitus without complications: Secondary | ICD-10-CM | POA: Diagnosis not present

## 2018-07-11 DIAGNOSIS — Z85828 Personal history of other malignant neoplasm of skin: Secondary | ICD-10-CM | POA: Diagnosis not present

## 2018-07-11 DIAGNOSIS — L570 Actinic keratosis: Secondary | ICD-10-CM | POA: Diagnosis not present

## 2018-07-11 DIAGNOSIS — Z1283 Encounter for screening for malignant neoplasm of skin: Secondary | ICD-10-CM | POA: Diagnosis not present

## 2018-07-11 DIAGNOSIS — L578 Other skin changes due to chronic exposure to nonionizing radiation: Secondary | ICD-10-CM | POA: Diagnosis not present

## 2018-07-16 ENCOUNTER — Other Ambulatory Visit: Payer: Self-pay | Admitting: Internal Medicine

## 2018-07-19 DIAGNOSIS — Z23 Encounter for immunization: Secondary | ICD-10-CM | POA: Diagnosis not present

## 2018-11-13 ENCOUNTER — Other Ambulatory Visit: Payer: Self-pay | Admitting: Internal Medicine

## 2018-12-05 ENCOUNTER — Ambulatory Visit: Payer: Medicare Other

## 2018-12-13 ENCOUNTER — Other Ambulatory Visit: Payer: Self-pay | Admitting: Internal Medicine

## 2018-12-18 ENCOUNTER — Ambulatory Visit: Payer: Medicare Other

## 2019-01-21 DIAGNOSIS — L255 Unspecified contact dermatitis due to plants, except food: Secondary | ICD-10-CM | POA: Diagnosis not present

## 2019-02-09 ENCOUNTER — Other Ambulatory Visit: Payer: Self-pay | Admitting: Internal Medicine

## 2019-02-20 ENCOUNTER — Other Ambulatory Visit: Payer: Self-pay

## 2019-02-20 ENCOUNTER — Ambulatory Visit (INDEPENDENT_AMBULATORY_CARE_PROVIDER_SITE_OTHER): Payer: Medicare Other

## 2019-02-20 VITALS — BP 147/73 | HR 95 | Temp 97.2°F | Wt 157.0 lb

## 2019-02-20 DIAGNOSIS — H401131 Primary open-angle glaucoma, bilateral, mild stage: Secondary | ICD-10-CM | POA: Diagnosis not present

## 2019-02-20 DIAGNOSIS — Z0000 Encounter for general adult medical examination without abnormal findings: Secondary | ICD-10-CM | POA: Diagnosis not present

## 2019-02-20 NOTE — Patient Instructions (Addendum)
  Tina Watson , Thank you for taking time to come for your Medicare Wellness Visit. I appreciate your ongoing commitment to your health goals. Please review the following plan we discussed and let me know if I can assist you in the future.   These are the goals we discussed: Goals    . Healthy Lifestyle     Walk for exercise  Stay hydrated!  Drink plenty of water Low carb/low cholesterol foods        This is a list of the screening recommended for you and due dates:  Health Maintenance  Topic Date Due  . Tetanus Vaccine  09/11/1948  . DEXA scan (bone density measurement)  09/11/1994  . Complete foot exam   06/01/2016  . Eye exam for diabetics  06/14/2017  . Hemoglobin A1C  07/03/2018  . Mammogram  12/25/2036*  . Flu Shot  04/12/2019  . Pneumonia vaccines  Completed  *Topic was postponed. The date shown is not the original due date.

## 2019-02-20 NOTE — Progress Notes (Signed)
Subjective:   Tina Watson is a 85 y.o. female who presents for Medicare Annual (Subsequent) preventive examination.  Review of Systems:  No ROS.  Medicare Wellness Virtual Visit.  Visual/audio telehealth visit, vital signs provided by patient.  See social history for additional risk factors.   Cardiac Risk Factors include: advanced age (>64men, >45 women);hypertension;diabetes mellitus     Objective:     Vitals: BP (!) 147/73 (BP Location: Left Arm, Patient Position: Sitting, Cuff Size: Normal)   Pulse 95   Temp (!) 97.2 F (36.2 C) (Oral)   Wt 157 lb (71.2 kg)   LMP 09/13/1984   BMI 27.81 kg/m   Body mass index is 27.81 kg/m.  Advanced Directives 02/20/2019 12/03/2017 11/30/2016 11/30/2015  Does Patient Have a Medical Advance Directive? Yes Yes Yes Yes  Type of Advance Directive Living will;Healthcare Power of [place];Living will [place];Living will  Does patient want to make changes to medical advance directive? No - Patient declined No - Patient declined No - Patient declined -  Copy of [place] in Chart? No - copy requested No - copy requested No - copy requested No - copy requested    Tobacco Social History   Tobacco Use  Smoking Status Never Smoker  Smokeless Tobacco Never Used     Counseling given: Not Answered   Clinical Intake:  Pre-visit preparation completed: Yes        Diabetes: Yes(Followed by pcp)  How often do you need to have someone help you when you read instructions, pamphlets, or other written materials from your doctor or pharmacy?: 1 - Never  Interpreter Needed?: No     Past Medical History:  Diagnosis Date  . Anemia   . Cancer (HCC)    Skin  . Chicken pox   . Colon polyps   . Diabetes mellitus without complication ([place])   . Diverticulosis   . Granuloma annulare   . Hypercholesterolemia   . Hypertension   . Osteoporosis     Past Surgical History:  Procedure Laterality Date  . MOHS SURGERY  07/2014   BCCA on nose  . TONSILLECTOMY  1952  . vaginal hysterectomy with anterior repair  1990   Family History  Problem Relation Age of Onset  . Stroke Father   . Kidney failure Father   . Cancer Mother        question of rectum/vaginal  . Heart disease Brother        x4  . Stroke Sister   . Dementia Sister   . Pneumonia Sister   . Breast cancer Neg Hx   . Colon cancer Neg Hx    Social History   Socioeconomic History  . Marital status: Widowed    Spouse name: Not on file  . Number of children: 2  . Years of education: Not on file  . Highest education level: Not on file  Occupational History  . Not on file  Social Needs  . Financial resource strain: Not hard at all  . Food insecurity    Worry: Never true    Inability: Never true  . Transportation needs    Medical: No    Non-medical: No  Tobacco Use  . Smoking status: Never Smoker  . Smokeless tobacco: Never Used  Substance and Sexual Activity  . Alcohol use: No    Alcohol/week: 0.0 standard drinks  . Drug use: No  . Sexual activity:  Never  Lifestyle  . Physical activity    Days per week: 0 days    Minutes per session: Not on file  . Stress: Not at all  Relationships  . Social Herbalist on phone: Not on file    Gets together: Not on file    Attends religious service: Not on file    Active member of club or organization: Not on file    Attends meetings of clubs or organizations: Not on file    Relationship status: Not on file  Other Topics Concern  . Not on file  Social History Narrative  . Not on file    Outpatient Encounter Medications as of 02/20/2019  Medication Sig  . ammonium lactate (LAC-HYDRIN) 12 % lotion Apply 1 application topically as needed for dry skin. Apply to arms as needed  . Calcium Carbonate-Vitamin D (CALCIUM 600+D) 600-400 MG-UNIT per tablet Take 1 tablet by mouth daily.  . ferrous sulfate 325 (65  FE) MG tablet TAKE 1 TABLET BY MOUTH DAILY  . fish oil-omega-3 fatty acids 1000 MG capsule Take 1 g by mouth daily.  Marland Kitchen glucose blood (BAYER CONTOUR NEXT TEST) test strip TEST ONCE DAILY  . latanoprost (XALATAN) 0.005 % ophthalmic solution Place 1 drop into both eyes at bedtime.  . Multiple Vitamin (MULTIVITAMIN) tablet Take 1 tablet by mouth daily.  Marland Kitchen telmisartan (MICARDIS) 40 MG tablet TAKE 1 TABLET BY MOUTH EVERY DAY  . triamterene-hydrochlorothiazide (MAXZIDE-25) 37.5-25 MG tablet TAKE 1/2 TABLET BY MOUTH ONCE A DAY   No facility-administered encounter medications on file as of 02/20/2019.     Activities of Daily Living In your present state of health, do you have any difficulty performing the following activities: 02/20/2019  Hearing? Y  Comment Hearing aid  Vision? N  Difficulty concentrating or making decisions? N  Walking or climbing stairs? N  Dressing or bathing? N  Doing errands, shopping? N  Preparing Food and eating ? N  Using the Toilet? N  In the past six months, have you accidently leaked urine? N  Do you have problems with loss of bowel control? N  Managing your Medications? N  Managing your Finances? N  Housekeeping or managing your Housekeeping? N  Some recent data might be hidden    Patient Care Team: Einar Pheasant, MD as PCP - General (Internal Medicine)    Assessment:   This is a routine wellness examination for [employer].  I connected with patient 02/20/19 at 11:30 AM EDT by a video/audio enabled telemedicine application and verified that I am speaking with the correct person using two identifiers. Patient stated full name and DOB. Patient gave permission to continue with virtual visit. Patient's location was at home and Nurse's location was at [place] office.   Notes tick bite on Saturday 02/15/19. Self removed and applied alcohol with bacitracin. Small red spot visible, non streaky or itching. Declines further intervention at this time.  Encouraged follow up  with pcp if symptoms worsen and as needed.   Reports blood sugar averaging 100-111.   Health Screenings  Mammogram - 06/2014 Colonoscopy - 12/2013 Glaucoma -drops in use Hearing - hearing aids Labs followed by pcp Dental- UTD Vision- visits within the last 12 months.  Social  Alcohol intake - no    yes      Smoking history- never    Smokers in home? none Illicit drug use? none Physical activity- very active in and out of the home Diet - regular Sexually  Active -never BMI- discussed the importance of a healthy diet, water intake and the benefits of aerobic exercise.  Educational material provided.   Safety  Patient feels safe at home- yes. Daughter lives with her.  Patient does have smoke detectors at home- yes Patient does wear sunscreen or protective clothing when in direct sunlight -yes Patient does wear seat belt when in a moving vehicle -yes  Covid-19 precautions and sickness symptoms discussed.   Activities of Daily Living Patient denies needing assistance with: driving, household chores, feeding themselves, getting from bed to chair, getting to the toilet, bathing/showering, dressing, managing money, or preparing meals.  No new identified risk were noted.    Depression Screen Patient denies losing interest in daily life, feeling hopeless, or crying easily over simple problems.   Medication-taking as directed and without issues.   Fall Screen Patient denies being afraid of falling or falling in the last year.   Memory Screen Patient is alert.  Patient denies difficulty focusing, concentrating or misplacing items. Correctly identified the president of the [place] , season and recall. Patient likes to read for brain stimulation.  Immunizations The following Immunizations were discussed: Influenza, shingles, pneumonia, and tetanus.   Other Providers Patient Care Team: Einar Pheasant, MD as PCP - General (Internal Medicine)  Exercise Activities and Dietary  recommendations Current Exercise Habits: The patient does not participate in regular exercise at present  Goals    . Healthy Lifestyle     Walk for exercise  Stay hydrated!  Drink plenty of water Low carb/low cholesterol foods        Fall Risk Fall Risk  02/20/2019 12/03/2017 11/30/2016 11/30/2015 11/30/2015  Falls in the past year? 0 No No No No   Depression Screen PHQ 2/9 Scores 02/20/2019 12/03/2017 11/30/2016 11/30/2015  PHQ - 2 Score 0 0 0 0     Cognitive Function MMSE - Mini Mental State Exam 11/30/2015  Orientation to time 5  Orientation to Place 5  Registration 3  Attention/ Calculation 5  Recall 3  Language- name 2 objects 2  Language- repeat 1  Language- follow 3 step command 3  Language- read & follow direction 1  Write a sentence 1  Copy design 1  Total score 30     6CIT Screen 02/20/2019 12/03/2017 11/30/2016  What Year? 0 points 0 points 0 points  What month? 0 points 0 points 0 points  What time? 0 points 0 points 0 points  Count back from 20 0 points 0 points 0 points  Months in reverse 0 points 0 points 0 points  Repeat phrase - - 0 points  Total Score - - 0    Immunization History  Administered Date(s) Administered  . Influenza Split 06/13/2012, 07/04/2012  . Influenza, High Dose Seasonal PF 08/21/2016, 07/03/2017, 07/19/2018  . Influenza,inj,Quad PF,6+ Mos 06/25/2013, 06/08/2014, 06/02/2015  . Influenza-Unspecified 07/04/2012, 06/25/2013, 06/08/2014  . Pneumococcal Conjugate-13 12/25/2016  . Pneumococcal Polysaccharide-23 01/01/2018   Screening Tests Health Maintenance  Topic Date Due  . TETANUS/TDAP  09/11/1948  . DEXA SCAN  09/11/1994  . FOOT EXAM  06/01/2016  . OPHTHALMOLOGY EXAM  06/14/2017  . HEMOGLOBIN A1C  07/03/2018  . MAMMOGRAM  12/25/2036 (Originally 07/03/2015)  . INFLUENZA VACCINE  04/12/2019  . PNA vac Low Risk Adult  Completed      Plan:    End of life planning; Advance aging; Advanced directives discussed.  Copy of current  HCPOA/Living Will requested.    I have personally reviewed and noted  the following in the patient's chart:   . Medical and social history . Use of alcohol, tobacco or illicit drugs  . Current medications and supplements . Functional ability and status . Nutritional status . Physical activity . Advanced directives . List of other physicians . Hospitalizations, surgeries, and ER visits in previous 12 months . Vitals . Screenings to include cognitive, depression, and falls . Referrals and appointments  In addition, I have reviewed and discussed with patient certain preventive protocols, quality metrics, and best practice recommendations. A written personalized care plan for preventive services as well as general preventive health recommendations were provided to patient.     Varney Biles, LPN  01/03/9562   Reviewed above information.  Agree with assessment and plan.  Agree with notification if any problems with site of tick bite or other symptoms.    Dr Nicki Reaper

## 2019-05-09 ENCOUNTER — Encounter: Payer: Medicare Other | Admitting: Internal Medicine

## 2019-06-06 ENCOUNTER — Other Ambulatory Visit: Payer: Self-pay

## 2019-06-10 ENCOUNTER — Encounter: Payer: Self-pay | Admitting: Internal Medicine

## 2019-06-10 ENCOUNTER — Other Ambulatory Visit: Payer: Self-pay

## 2019-06-10 ENCOUNTER — Ambulatory Visit (INDEPENDENT_AMBULATORY_CARE_PROVIDER_SITE_OTHER): Payer: Medicare Other | Admitting: Internal Medicine

## 2019-06-10 DIAGNOSIS — I10 Essential (primary) hypertension: Secondary | ICD-10-CM

## 2019-06-10 DIAGNOSIS — E7800 Pure hypercholesterolemia, unspecified: Secondary | ICD-10-CM | POA: Diagnosis not present

## 2019-06-10 DIAGNOSIS — Z23 Encounter for immunization: Secondary | ICD-10-CM

## 2019-06-10 DIAGNOSIS — E119 Type 2 diabetes mellitus without complications: Secondary | ICD-10-CM | POA: Diagnosis not present

## 2019-06-10 DIAGNOSIS — D649 Anemia, unspecified: Secondary | ICD-10-CM | POA: Diagnosis not present

## 2019-06-10 DIAGNOSIS — Z0000 Encounter for general adult medical examination without abnormal findings: Secondary | ICD-10-CM

## 2019-06-10 LAB — HM DIABETES FOOT EXAM

## 2019-06-10 NOTE — Progress Notes (Signed)
Patient ID: Tina Watson, female   DOB: Jan 08, 1928, 85 y.o.   MRN: [id number]   Subjective:    Patient ID: Tina Watson, female    DOB: 12/27/1927, 85 y.o.   MRN: [id number]  HPI  Patient with past history of hypercholesterolemia, diabetes and hypertension.  She comes in today to follow up on these issues as well as for a complete physical exam.  She reports she is doing well.  No cough or congestion.  No chest pain or sob.  No acid reflux.  No abdominal pain.  Bowels moving.  Blood pressure doing well.     Past Medical History:  Diagnosis Date  . Anemia   . Cancer (HCC)    Skin  . Chicken pox   . Colon polyps   . Diabetes mellitus without complication ([place])   . Diverticulosis   . Granuloma annulare   . Hypercholesterolemia   . Hypertension   . Osteoporosis    Past Surgical History:  Procedure Laterality Date  . MOHS SURGERY  07/2014   BCCA on nose  . TONSILLECTOMY  1952  . vaginal hysterectomy with anterior repair  1990   Family History  Problem Relation Age of Onset  . Stroke Father   . Kidney failure Father   . Cancer Mother        question of rectum/vaginal  . Heart disease Brother        x4  . Stroke Sister   . Dementia Sister   . Pneumonia Sister   . Breast cancer Neg Hx   . Colon cancer Neg Hx    Social History   Socioeconomic History  . Marital status: Widowed    Spouse name: Not on file  . Number of children: 2  . Years of education: Not on file  . Highest education level: Not on file  Occupational History  . Not on file  Social Needs  . Financial resource strain: Not hard at all  . Food insecurity    Worry: Never true    Inability: Never true  . Transportation needs    Medical: No    Non-medical: No  Tobacco Use  . Smoking status: Never Smoker  . Smokeless tobacco: Never Used  Substance and Sexual Activity  . Alcohol use: No    Alcohol/week: 0.0 standard drinks  . Drug use: No  . Sexual activity: Never  Lifestyle  . Physical activity   Days per week: 0 days    Minutes per session: Not on file  . Stress: Not at all  Relationships  . Social Herbalist on phone: Not on file    Gets together: Not on file    Attends religious service: Not on file    Active member of club or organization: Not on file    Attends meetings of clubs or organizations: Not on file    Relationship status: Not on file  Other Topics Concern  . Not on file  Social History Narrative  . Not on file    Outpatient Encounter Medications as of 06/10/2019  Medication Sig  . ammonium lactate (LAC-HYDRIN) 12 % lotion Apply 1 application topically as needed for dry skin. Apply to arms as needed  . Calcium Carbonate-Vitamin D (CALCIUM 600+D) 600-400 MG-UNIT per tablet Take 1 tablet by mouth daily.  . ferrous sulfate 325 (65 FE) MG tablet TAKE 1 TABLET BY MOUTH DAILY  . fish oil-omega-3 fatty acids 1000 MG capsule Take 1 g by  mouth daily.  Marland Kitchen glucose blood (BAYER CONTOUR NEXT TEST) test strip TEST ONCE DAILY  . latanoprost (XALATAN) 0.005 % ophthalmic solution Place 1 drop into both eyes at bedtime.  . Multiple Vitamin (MULTIVITAMIN) tablet Take 1 tablet by mouth daily.  Marland Kitchen triamterene-hydrochlorothiazide (MAXZIDE-25) 37.5-25 MG tablet TAKE 1/2 TABLET BY MOUTH ONCE A DAY  . [DISCONTINUED] telmisartan (MICARDIS) 40 MG tablet TAKE 1 TABLET BY MOUTH EVERY DAY   No facility-administered encounter medications on file as of 06/10/2019.     Review of Systems  Constitutional: Negative for appetite change and unexpected weight change.  HENT: Negative for congestion and sinus pressure.   Eyes: Negative for pain and visual disturbance.  Respiratory: Negative for cough, chest tightness and shortness of breath.   Cardiovascular: Negative for chest pain, palpitations and leg swelling.  Gastrointestinal: Negative for abdominal pain, diarrhea, nausea and vomiting.  Genitourinary: Negative for difficulty urinating and dysuria.  Musculoskeletal: Negative for joint  swelling and myalgias.  Skin: Negative for color change and rash.  Neurological: Negative for dizziness, light-headedness and headaches.  Hematological: Negative for adenopathy. Does not bruise/bleed easily.  Psychiatric/Behavioral: Negative for agitation and dysphoric mood.       Objective:    Physical Exam Constitutional:      General: She is not in acute distress.    Appearance: Normal appearance. She is well-developed.  HENT:     Right Ear: External ear normal.     Left Ear: External ear normal.  Eyes:     General: No scleral icterus.       Right eye: No discharge.        Left eye: No discharge.     Conjunctiva/sclera: Conjunctivae normal.  Neck:     Musculoskeletal: Neck supple. No muscular tenderness.     Thyroid: No thyromegaly.  Cardiovascular:     Rate and Rhythm: Normal rate and regular rhythm.  Pulmonary:     Effort: No tachypnea, accessory muscle usage or respiratory distress.     Breath sounds: Normal breath sounds. No decreased breath sounds or wheezing.  Chest:     Breasts:        Right: No inverted nipple, mass, nipple discharge or tenderness (no axillary adenopathy).        Left: No inverted nipple, mass, nipple discharge or tenderness (no axilarry adenopathy).  Abdominal:     General: Bowel sounds are normal.     Palpations: Abdomen is soft.     Tenderness: There is no abdominal tenderness.  Musculoskeletal:        General: No swelling or tenderness.  Lymphadenopathy:     Cervical: No cervical adenopathy.  Skin:    Findings: No erythema or rash.  Neurological:     Mental Status: She is alert and oriented to person, place, and time.  Psychiatric:        Mood and Affect: Mood normal.        Behavior: Behavior normal.     BP 112/62   Pulse 80   Temp 97.7 F (36.5 C)   Resp 16   Ht 5' 3"  (1.6 m)   Wt 158 lb 3.2 oz (71.8 kg)   LMP 09/13/1984   SpO2 97%   BMI 28.02 kg/m  Wt Readings from Last 3 Encounters:  06/10/19 158 lb 3.2 oz (71.8 kg)   02/20/19 157 lb (71.2 kg)  01/01/18 159 lb 3.2 oz (72.2 kg)     Lab Results  Component Value Date   WBC 8.3 01/01/2018  HGB 14.2 01/01/2018   HCT 41.4 01/01/2018   PLT 258.0 01/01/2018   GLUCOSE 103 (H) 01/01/2018   CHOL 319 (H) 01/01/2018   TRIG (H) 01/01/2018    409.0 Triglyceride is over 400; calculations on Lipids are invalid.   HDL 59.00 01/01/2018   LDLDIRECT 189.0 01/01/2018   ALT 28 01/01/2018   AST 23 01/01/2018   NA 138 01/01/2018   K 3.7 01/01/2018   CL 99 01/01/2018   CREATININE 1.02 01/01/2018   BUN 24 (H) 01/01/2018   CO2 31 01/01/2018   TSH 2.60 01/01/2018   HGBA1C 6.7 (H) 01/01/2018   MICROALBUR <0.7 01/01/2018        Assessment & Plan:   Problem List Items Addressed This Visit    Anemia    Follow cbc.       Relevant Orders   CBC with Differential/Platelet   Diabetes ([place])    Low carb diet and exercise.  Follow met b and a1c.       Relevant Orders   Hemoglobin E4H   Basic metabolic panel   Microalbumin / creatinine urine ratio   Health care maintenance    Physical today 06/10/19.  Declines mammogram.  Colonoscopy 12/2013.        Hypercholesterolemia    Had intolerance to cholesterol medication.  Desires not to restart.  Feels better off medication.  Follow lipid panel.  Low cholesterol diet and exercise.        Relevant Orders   Hepatic function panel   Lipid panel   Hypertension    Blood pressure under good control.  Continue same medication regimen.  Follow pressures.  Follow metabolic panel.        Relevant Orders   TSH    Other Visit Diagnoses    Need for immunization against influenza       Relevant Orders   Flu Vaccine QUAD High Dose(Fluad) (Completed)       Einar Pheasant, MD

## 2019-06-11 ENCOUNTER — Other Ambulatory Visit: Payer: Self-pay | Admitting: Internal Medicine

## 2019-06-15 ENCOUNTER — Encounter: Payer: Self-pay | Admitting: Internal Medicine

## 2019-06-15 NOTE — Assessment & Plan Note (Signed)
Low carb diet and exercise.  Follow met b and a1c.  

## 2019-06-15 NOTE — Assessment & Plan Note (Signed)
Had intolerance to cholesterol medication.  Desires not to restart.  Feels better off medication.  Follow lipid panel.  Low cholesterol diet and exercise.

## 2019-06-15 NOTE — Assessment & Plan Note (Signed)
Blood pressure under good control.  Continue same medication regimen.  Follow pressures.  Follow metabolic panel.   

## 2019-06-15 NOTE — Assessment & Plan Note (Signed)
Physical today 06/10/19.  Declines mammogram.  Colonoscopy 12/2013.

## 2019-06-15 NOTE — Assessment & Plan Note (Signed)
Follow cbc.  

## 2019-06-27 ENCOUNTER — Other Ambulatory Visit (INDEPENDENT_AMBULATORY_CARE_PROVIDER_SITE_OTHER): Payer: Medicare Other

## 2019-06-27 ENCOUNTER — Other Ambulatory Visit: Payer: Self-pay

## 2019-06-27 DIAGNOSIS — D649 Anemia, unspecified: Secondary | ICD-10-CM | POA: Diagnosis not present

## 2019-06-27 DIAGNOSIS — E119 Type 2 diabetes mellitus without complications: Secondary | ICD-10-CM

## 2019-06-27 DIAGNOSIS — E7800 Pure hypercholesterolemia, unspecified: Secondary | ICD-10-CM

## 2019-06-27 DIAGNOSIS — I10 Essential (primary) hypertension: Secondary | ICD-10-CM | POA: Diagnosis not present

## 2019-06-27 LAB — CBC WITH DIFFERENTIAL/PLATELET
Basophils Absolute: 0.1 10*3/uL (ref 0.0–0.1)
Basophils Relative: 0.8 % (ref 0.0–3.0)
Eosinophils Absolute: 0.3 10*3/uL (ref 0.0–0.7)
Eosinophils Relative: 3.4 % (ref 0.0–5.0)
HCT: 38.6 % (ref 36.0–46.0)
Hemoglobin: 12.9 g/dL (ref 12.0–15.0)
Lymphocytes Relative: 43.8 % (ref 12.0–46.0)
Lymphs Abs: 3.7 10*3/uL (ref 0.7–4.0)
MCHC: 33.3 g/dL (ref 30.0–36.0)
MCV: 89.7 fl (ref 78.0–100.0)
Monocytes Absolute: 0.8 10*3/uL (ref 0.1–1.0)
Monocytes Relative: 9.7 % (ref 3.0–12.0)
Neutro Abs: 3.5 10*3/uL (ref 1.4–7.7)
Neutrophils Relative %: 42.3 % — ABNORMAL LOW (ref 43.0–77.0)
Platelets: 233 10*3/uL (ref 150.0–400.0)
RBC: 4.31 Mil/uL (ref 3.87–5.11)
RDW: 13.5 % (ref 11.5–15.5)
WBC: 8.3 10*3/uL (ref 4.0–10.5)

## 2019-06-27 LAB — BASIC METABOLIC PANEL
BUN: 28 mg/dL — ABNORMAL HIGH (ref 6–23)
CO2: 32 mEq/L (ref 19–32)
Calcium: 9.4 mg/dL (ref 8.4–10.5)
Chloride: 100 mEq/L (ref 96–112)
Creatinine, Ser: 1.15 mg/dL (ref 0.40–1.20)
GFR: 44.35 mL/min — ABNORMAL LOW (ref >60.00)
Glucose, Bld: 113 mg/dL — ABNORMAL HIGH (ref 70–99)
Potassium: 3.7 mEq/L (ref 3.5–5.1)
Sodium: 139 mEq/L (ref 135–145)

## 2019-06-27 LAB — HEPATIC FUNCTION PANEL
ALT: 18 U/L (ref 0–35)
AST: 15 U/L (ref 0–37)
Albumin: 4.1 g/dL (ref 3.5–5.2)
Alkaline Phosphatase: 62 U/L (ref 39–117)
Bilirubin, Direct: 0.1 mg/dL (ref 0.0–0.3)
Total Bilirubin: 0.6 mg/dL (ref 0.2–1.2)
Total Protein: 6.8 g/dL (ref 6.0–8.3)

## 2019-06-27 LAB — LIPID PANEL
Cholesterol: 321 mg/dL — ABNORMAL HIGH (ref 0–200)
HDL: 53.9 mg/dL (ref >39.00)
Total CHOL/HDL Ratio: 6
Triglycerides: 404 mg/dL — ABNORMAL HIGH (ref 0.0–149.0)

## 2019-06-27 LAB — MICROALBUMIN / CREATININE URINE RATIO
Creatinine,U: 95.2 mg/dL
Microalb Creat Ratio: 0.8 mg/g (ref 0.0–30.0)
Microalb, Ur: 0.7 mg/dL (ref 0.0–1.9)

## 2019-06-27 LAB — TSH: TSH: 3.02 u[IU]/mL (ref 0.35–4.50)

## 2019-06-27 LAB — LDL CHOLESTEROL, DIRECT: Direct LDL: 185 mg/dL

## 2019-06-27 LAB — HEMOGLOBIN A1C: Hgb A1c MFr Bld: 7.1 % — ABNORMAL HIGH (ref 4.6–6.5)

## 2019-07-07 ENCOUNTER — Ambulatory Visit (INDEPENDENT_AMBULATORY_CARE_PROVIDER_SITE_OTHER): Payer: Medicare Other | Admitting: Internal Medicine

## 2019-07-07 DIAGNOSIS — E1165 Type 2 diabetes mellitus with hyperglycemia: Secondary | ICD-10-CM

## 2019-07-07 DIAGNOSIS — E7800 Pure hypercholesterolemia, unspecified: Secondary | ICD-10-CM | POA: Diagnosis not present

## 2019-07-07 DIAGNOSIS — I10 Essential (primary) hypertension: Secondary | ICD-10-CM

## 2019-07-07 MED ORDER — LOVASTATIN 10 MG PO TABS: ORAL_TABLET | ORAL | 2 refills | Status: DC

## 2019-07-07 NOTE — Progress Notes (Signed)
Patient ID: Tina Watson, female   DOB: [birth year], 85 y.o.   MRN: [id number]   Virtual Visit via telephone Note  This visit type was conducted due to national recommendations for restrictions regarding the COVID-19 pandemic (e.g. social distancing).  This format is felt to be most appropriate for this patient at this time.  All issues noted in this document were discussed and addressed.  No physical exam was performed (except for noted visual exam findings with Video Visits).   I connected with Tina Watson or telephone and verified that I am speaking with the correct person using two identifiers. Location patient: home Location provider: work  Persons participating in the telephone visit: patient, provider  I discussed the limitations, risks, security and privacy concerns of performing an evaluation and management service by telephone and the availability of in person appointments. The patient expressed understanding and agreed to proceed.   Reason for visit:  Work in appt to discuss labs.   HPI: She reports she is doing relatively well.  Recent labs revealed elevated a1c - 7.1.  Discussed diabetes.  Discussed diet and exercise.  Discussed treatment - including medication.  She declines medication.  Wants to work on diet and exercise.  Also discussed her elevated triglycerides and diet adjustment related to this.  Discussed "[facility] Lipid Diet".  Will send information.  No chest pain.  No sob.  No acid reflux.  No abdominal pain.  Bowels moving.  Overall she feels good and would like to avoid medications.     ROS: See pertinent positives and negatives per HPI.  Past Medical History:  Diagnosis Date  . Anemia   . Cancer (HCC)    Skin  . Chicken pox   . Colon polyps   . Diabetes mellitus without complication ([place])   . Diverticulosis   . Granuloma annulare   . Hypercholesterolemia   . Hypertension   . Osteoporosis     Past Surgical History:  Procedure Laterality Date  . MOHS SURGERY   07/2014   BCCA on nose  . TONSILLECTOMY  1952  . vaginal hysterectomy with anterior repair  1990    Family History  Problem Relation Age of Onset  . Stroke Father   . Kidney failure Father   . Cancer Mother        question of rectum/vaginal  . Heart disease Brother        x4  . Stroke Sister   . Dementia Sister   . Pneumonia Sister   . Breast cancer Neg Hx   . Colon cancer Neg Hx     SOCIAL HX: reviewed.    Current Outpatient Medications:  .  ammonium lactate (LAC-HYDRIN) 12 % lotion, Apply 1 application topically as needed for dry skin. Apply to arms as needed, Disp: , Rfl:  .  Calcium Carbonate-Vitamin D (CALCIUM 600+D) 600-400 MG-UNIT per tablet, Take 1 tablet by mouth daily., Disp: , Rfl:  .  ferrous sulfate 325 (65 FE) MG tablet, TAKE 1 TABLET BY MOUTH DAILY, Disp: 30 tablet, Rfl: 5 .  fish oil-omega-3 fatty acids 1000 MG capsule, Take 1 g by mouth daily., Disp: , Rfl:  .  glucose blood (BAYER CONTOUR NEXT TEST) test strip, TEST ONCE DAILY, Disp: 100 each, Rfl: 2 .  latanoprost (XALATAN) 0.005 % ophthalmic solution, Place 1 drop into both eyes at bedtime., Disp: , Rfl:  .  lovastatin (MEVACOR) 10 MG tablet, Take on tablet two times per week., Disp: 10 tablet, Rfl: 2 .  Multiple Vitamin (MULTIVITAMIN) tablet, Take 1 tablet by mouth daily., Disp: , Rfl:  .  telmisartan (MICARDIS) 40 MG tablet, TAKE 1 TABLET BY MOUTH EVERY DAY, Disp: 90 tablet, Rfl: 1 .  triamterene-hydrochlorothiazide (MAXZIDE-25) 37.5-25 MG tablet, TAKE 1/2 TABLET BY MOUTH ONCE A DAY, Disp: 45 tablet, Rfl: 1  EXAM:  GENERAL: alert.  Sounds to be in no acute distress.  Answering questions appropriately.    PSYCH/NEURO: pleasant and cooperative, no obvious depression or anxiety, speech and thought processing grossly intact  ASSESSMENT AND PLAN:  Discussed the following assessment and plan:  Diabetes Recent a1c 7.1. Discussed diet and exercise.  Discussed treatment - including medication.  She declines  to start oral medication.  Wants to work on diet and exercise.  Follow met b and a1c.   Hypercholesterolemia Had intolerance to cholesterol medication.  Discussed alternatives to statin medication.  Discussed zetia and repatha.  Also discussed trial of another statin medication.  She has tried lipitor, pravastatin, crestor.   Discussed risk factor modification.  Agreed to a trial of mevacor. Low cholesterol diet and exercise.  Follow lipid panel and liver function tests.    Hypertension Reports blood pressure doing well.  Follow.      I discussed the assessment and treatment plan with the patient. The patient was provided an opportunity to ask questions and all were answered. The patient agreed with the plan and demonstrated an understanding of the instructions.   The patient was advised to call back or seek an in-person evaluation if the symptoms worsen or if the condition fails to improve as anticipated.  I provided 25 minutes of non-face-to-face time during this encounter.   Einar Pheasant, MD

## 2019-07-13 ENCOUNTER — Encounter: Payer: Self-pay | Admitting: Internal Medicine

## 2019-07-13 NOTE — Assessment & Plan Note (Signed)
Reports blood pressure doing well.  Follow.

## 2019-07-13 NOTE — Assessment & Plan Note (Addendum)
Had intolerance to cholesterol medication.  Discussed alternatives to statin medication.  Discussed zetia and repatha.  Also discussed trial of another statin medication.  She has tried lipitor, pravastatin, crestor.   Discussed risk factor modification.  Agreed to a trial of mevacor. Low cholesterol diet and exercise.  Follow lipid panel and liver function tests.

## 2019-07-13 NOTE — Assessment & Plan Note (Signed)
Recent a1c 7.1. Discussed diet and exercise.  Discussed treatment - including medication.  She declines to start oral medication.  Wants to work on diet and exercise.  Follow met b and a1c.

## 2019-07-16 DIAGNOSIS — Z85828 Personal history of other malignant neoplasm of skin: Secondary | ICD-10-CM | POA: Diagnosis not present

## 2019-07-16 DIAGNOSIS — L578 Other skin changes due to chronic exposure to nonionizing radiation: Secondary | ICD-10-CM | POA: Diagnosis not present

## 2019-07-16 DIAGNOSIS — L570 Actinic keratosis: Secondary | ICD-10-CM | POA: Diagnosis not present

## 2019-07-16 DIAGNOSIS — Z872 Personal history of diseases of the skin and subcutaneous tissue: Secondary | ICD-10-CM | POA: Diagnosis not present

## 2019-08-16 ENCOUNTER — Other Ambulatory Visit: Payer: Self-pay | Admitting: Internal Medicine

## 2019-08-20 DIAGNOSIS — H401131 Primary open-angle glaucoma, bilateral, mild stage: Secondary | ICD-10-CM | POA: Diagnosis not present

## 2019-11-16 ENCOUNTER — Other Ambulatory Visit: Payer: Self-pay | Admitting: Internal Medicine

## 2019-11-26 ENCOUNTER — Other Ambulatory Visit: Payer: Self-pay | Admitting: Internal Medicine

## 2019-12-08 ENCOUNTER — Telehealth (INDEPENDENT_AMBULATORY_CARE_PROVIDER_SITE_OTHER): Payer: Medicare Other | Admitting: Internal Medicine

## 2019-12-08 ENCOUNTER — Other Ambulatory Visit: Payer: Self-pay

## 2019-12-08 DIAGNOSIS — I10 Essential (primary) hypertension: Secondary | ICD-10-CM | POA: Diagnosis not present

## 2019-12-08 DIAGNOSIS — E1165 Type 2 diabetes mellitus with hyperglycemia: Secondary | ICD-10-CM

## 2019-12-08 DIAGNOSIS — E7800 Pure hypercholesterolemia, unspecified: Secondary | ICD-10-CM | POA: Diagnosis not present

## 2019-12-08 DIAGNOSIS — D649 Anemia, unspecified: Secondary | ICD-10-CM

## 2019-12-08 NOTE — Progress Notes (Signed)
Patient ID: Tina Watson, female   DOB: 22-Jul-1928, 86 y.o.   MRN: [id number]   Virtual Visit via telephone  Note  This visit type was conducted due to national recommendations for restrictions regarding the COVID-19 pandemic (e.g. social distancing).  This format is felt to be most appropriate for this patient at this time.  All issues noted in this document were discussed and addressed.  No physical exam was performed (except for noted visual exam findings with Video Visits).   I connected with Casper Harrison by telephone and verified that I am speaking with the correct person using two identifiers. Location patient: home Location provider: work Persons participating in the telephone visit: patient, provider  I discussed the limitations, risks, security and privacy concerns of performing an evaluation and management service by telephone and the availability of in person appointments. The patient expressed understanding and agreed to proceed.   Reason for visit: scheduled follow up   HPI: She reports she is doing well.  Feels good.  Stays active.  No chest pain or sob with increased activity or exertion.  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  No cough or congestion.  No acid reflux.  No abdominal pain or cramping.  Bowels moving.  She is trying to watch her sugar/carbs.  Last a1c 7.1.  Has decreased bread intake.  Changed to eating sweet potatoes. No soft drinks.  States am sugars usually 100-120.  Highest sugar reading:  139.  States blood pressures 130/70s.  Overall she feels she is doing well.  Tolerating mevacor - 2 days per week.  Due labs.    ROS: See pertinent positives and negatives per HPI.  Past Medical History:  Diagnosis Date  . Anemia   . Cancer (HCC)    Skin  . Chicken pox   . Colon polyps   . Diabetes mellitus without complication ([place])   . Diverticulosis   . Granuloma annulare   . Hypercholesterolemia   . Hypertension   . Osteoporosis     Past Surgical  History:  Procedure Laterality Date  . MOHS SURGERY  07/2014   BCCA on nose  . TONSILLECTOMY  1952  . vaginal hysterectomy with anterior repair  1990    Family History  Problem Relation Age of Onset  . Stroke Father   . Kidney failure Father   . Cancer Mother        question of rectum/vaginal  . Heart disease Brother        x4  . Stroke Sister   . Dementia Sister   . Pneumonia Sister   . Breast cancer Neg Hx   . Colon cancer Neg Hx     SOCIAL HX: reviewed.    Current Outpatient Medications:  .  ammonium lactate (LAC-HYDRIN) 12 % lotion, Apply 1 application topically as needed for dry skin. Apply to arms as needed, Disp: , Rfl:  .  Calcium Carbonate-Vitamin D (CALCIUM 600+D) 600-400 MG-UNIT per tablet, Take 1 tablet by mouth daily., Disp: , Rfl:  .  ferrous sulfate 325 (65 FE) MG tablet, TAKE 1 TABLET BY MOUTH DAILY, Disp: 30 tablet, Rfl: 5 .  fish oil-omega-3 fatty acids 1000 MG capsule, Take 1 g by mouth daily., Disp: , Rfl:  .  glucose blood (BAYER CONTOUR NEXT TEST) test strip, TEST ONCE DAILY, Disp: 100 each, Rfl: 2 .  latanoprost (XALATAN) 0.005 % ophthalmic solution, Place 1 drop into both eyes at bedtime., Disp: , Rfl:  .  lovastatin (  MEVACOR) 10 MG tablet, TAKE ONE TABLET TWO TIMES PER WEEK., Disp: 30 tablet, Rfl: 0 .  Multiple Vitamin (MULTIVITAMIN) tablet, Take 1 tablet by mouth daily., Disp: , Rfl:  .  telmisartan (MICARDIS) 40 MG tablet, TAKE 1 TABLET BY MOUTH EVERY DAY, Disp: 90 tablet, Rfl: 1 .  triamterene-hydrochlorothiazide (MAXZIDE-25) 37.5-25 MG tablet, TAKE 1/2 TABLET BY MOUTH EVERY DAY, Disp: 45 tablet, Rfl: 1  EXAM:  VITALS per patient if applicable: 062/37S  GENERAL: alert.  Sounds to be in no acute distress.  Answering questions appropriately.    PSYCH/NEURO: pleasant and cooperative, no obvious depression or anxiety, speech and thought processing grossly intact  ASSESSMENT AND PLAN:  Discussed the following assessment and  plan:  Anemia Follow cbc.   Diabetes Last a1c 7.1.  She has adjusted her diet as outlined.  On no medication.  Follow met b and a1c.  Continue low carb diet and exercise.    Hypercholesterolemia Has had intolerance to cholesterol medications.  She is tolerating mevacor 2 days per week.  Low cholesterol diet and exercise.  Follow lipid panel and liver function tests.    Hypertension Blood pressure as outlined.  Continue current medication regimen - triam/hctz and micardis.   Follow pressures.  Follow metabolic panel.    Orders Placed This Encounter  Procedures  . Hemoglobin A1c    Standing Status:   Future    Standing Expiration Date:   12/14/2020  . Hepatic function panel    Standing Status:   Future    Standing Expiration Date:   12/14/2020  . Lipid panel    Standing Status:   Future    Standing Expiration Date:   12/14/2020  . Basic metabolic panel    Standing Status:   Future    Standing Expiration Date:   12/14/2020    No orders of the defined types were placed in this encounter.    I discussed the assessment and treatment plan with the patient. The patient was provided an opportunity to ask questions and all were answered. The patient agreed with the plan and demonstrated an understanding of the instructions.   The patient was advised to call back or seek an in-person evaluation if the symptoms worsen or if the condition fails to improve as anticipated.  I provided 23 minutes of non-face-to-face time during this encounter.   Einar Pheasant, MD

## 2019-12-15 ENCOUNTER — Encounter: Payer: Self-pay | Admitting: Internal Medicine

## 2019-12-15 NOTE — Assessment & Plan Note (Signed)
Last a1c 7.1.  She has adjusted her diet as outlined.  On no medication.  Follow met b and a1c.  Continue low carb diet and exercise.

## 2019-12-15 NOTE — Assessment & Plan Note (Signed)
Has had intolerance to cholesterol medications.  She is tolerating mevacor 2 days per week.  Low cholesterol diet and exercise.  Follow lipid panel and liver function tests.

## 2019-12-15 NOTE — Assessment & Plan Note (Addendum)
Blood pressure as outlined.  Continue current medication regimen - triam/hctz and micardis.   Follow pressures.  Follow metabolic panel.

## 2019-12-15 NOTE — Assessment & Plan Note (Signed)
Follow cbc.  

## 2019-12-29 ENCOUNTER — Other Ambulatory Visit: Payer: Self-pay

## 2019-12-29 ENCOUNTER — Other Ambulatory Visit (INDEPENDENT_AMBULATORY_CARE_PROVIDER_SITE_OTHER): Payer: Medicare Other

## 2019-12-29 DIAGNOSIS — E7800 Pure hypercholesterolemia, unspecified: Secondary | ICD-10-CM | POA: Diagnosis not present

## 2019-12-29 DIAGNOSIS — E1165 Type 2 diabetes mellitus with hyperglycemia: Secondary | ICD-10-CM | POA: Diagnosis not present

## 2019-12-29 LAB — BASIC METABOLIC PANEL
BUN: 25 mg/dL — ABNORMAL HIGH (ref 6–23)
CO2: 32 mEq/L (ref 19–32)
Calcium: 9.4 mg/dL (ref 8.4–10.5)
Chloride: 99 mEq/L (ref 96–112)
Creatinine, Ser: 1.01 mg/dL (ref 0.40–1.20)
GFR: 51.46 mL/min — ABNORMAL LOW (ref >60.00)
Glucose, Bld: 117 mg/dL — ABNORMAL HIGH (ref 70–99)
Potassium: 3.9 mEq/L (ref 3.5–5.1)
Sodium: 138 mEq/L (ref 135–145)

## 2019-12-29 LAB — HEPATIC FUNCTION PANEL
ALT: 18 U/L (ref 0–35)
AST: 16 U/L (ref 0–37)
Albumin: 4.3 g/dL (ref 3.5–5.2)
Alkaline Phosphatase: 65 U/L (ref 39–117)
Bilirubin, Direct: 0 mg/dL (ref 0.0–0.3)
Total Bilirubin: 0.5 mg/dL (ref 0.2–1.2)
Total Protein: 7.1 g/dL (ref 6.0–8.3)

## 2019-12-29 LAB — LIPID PANEL
Cholesterol: 303 mg/dL — ABNORMAL HIGH (ref 0–200)
HDL: 61.7 mg/dL (ref >39.00)
NonHDL: 241.69
Total CHOL/HDL Ratio: 5
Triglycerides: 394 mg/dL — ABNORMAL HIGH (ref 0.0–149.0)
VLDL: 78.8 mg/dL — ABNORMAL HIGH (ref 0.0–40.0)

## 2019-12-29 LAB — HEMOGLOBIN A1C: Hgb A1c MFr Bld: 6.9 % — ABNORMAL HIGH (ref 4.6–6.5)

## 2019-12-29 LAB — LDL CHOLESTEROL, DIRECT: Direct LDL: 171 mg/dL

## 2019-12-30 ENCOUNTER — Other Ambulatory Visit: Payer: Self-pay

## 2019-12-30 MED ORDER — LOVASTATIN 10 MG PO TABS: ORAL_TABLET | ORAL | 0 refills | Status: DC

## 2020-02-03 DIAGNOSIS — H401131 Primary open-angle glaucoma, bilateral, mild stage: Secondary | ICD-10-CM | POA: Diagnosis not present

## 2020-02-18 DIAGNOSIS — H401131 Primary open-angle glaucoma, bilateral, mild stage: Secondary | ICD-10-CM | POA: Diagnosis not present

## 2020-02-23 ENCOUNTER — Ambulatory Visit (INDEPENDENT_AMBULATORY_CARE_PROVIDER_SITE_OTHER): Payer: Medicare Other

## 2020-02-23 VITALS — BP 120/64 | HR 91 | Ht 63.0 in | Wt 160.0 lb

## 2020-02-23 DIAGNOSIS — Z0000 Encounter for general adult medical examination without abnormal findings: Secondary | ICD-10-CM | POA: Diagnosis not present

## 2020-02-23 NOTE — Patient Instructions (Addendum)
  Tina Watson , Thank you for taking time to come for your Medicare Wellness Visit. I appreciate your ongoing commitment to your health goals. Please review the following plan we discussed and let me know if I can assist you in the future.   These are the goals we discussed: Goals    . Healthy Lifestyle     Walk for exercise  Stay hydrated!  Drink plenty of water Low carb/low cholesterol foods        This is a list of the screening recommended for you and due dates:  Health Maintenance  Topic Date Due  . Tetanus Vaccine  Never done  . DEXA scan (bone density measurement)  Never done  . Mammogram  12/25/2036*  . Flu Shot  04/11/2020  . Complete foot exam   06/09/2020  . Hemoglobin A1C  06/29/2020  . Eye exam for diabetics  02/17/2021  . COVID-19 Vaccine  Completed  . Pneumonia vaccines  Completed  *Topic was postponed. The date shown is not the original due date.

## 2020-02-23 NOTE — Progress Notes (Signed)
Subjective:   Tina Watson is a 86 y.o. female who presents for Medicare Annual (Subsequent) preventive examination.  Review of Systems:  No ROS.  Medicare Wellness Virtual Visit.   Cardiac Risk Factors include: advanced age (>48men, >20 women);hypertension;diabetes mellitus     Objective:     Vitals: BP 120/64 (BP Location: Left Arm, Patient Position: Sitting)   Pulse 91   Ht 5\' 3"  (1.6 m)   Wt 160 lb (72.6 kg)   LMP 09/13/1984   BMI 28.34 kg/m   Body mass index is 28.34 kg/m.  Advanced Directives 02/23/2020 02/20/2019 12/03/2017 11/30/2016 11/30/2015  Does Patient Have a Medical Advance Directive? Yes Yes Yes Yes Yes  Type of Paramedic of [place];Living will Living will;Healthcare Power of [place];Living will [place];Living will  Does patient want to make changes to medical advance directive? No - Patient declined No - Patient declined No - Patient declined No - Patient declined -  Copy of [place] in Chart? No - copy requested No - copy requested No - copy requested No - copy requested No - copy requested    Tobacco Social History   Tobacco Use  Smoking Status Never Smoker  Smokeless Tobacco Never Used     Counseling given: Not Answered   Clinical Intake:  Pre-visit preparation completed: Yes        Diabetes: Yes  How often do you need to have someone help you when you read instructions, pamphlets, or other written materials from your doctor or pharmacy?: 1 - Never  Interpreter Needed?: No     Past Medical History:  Diagnosis Date  . Anemia   . Cancer (HCC)    Skin  . Chicken pox   . Colon polyps   . Diabetes mellitus without complication ([place])   . Diverticulosis   . Granuloma annulare   . Hypercholesterolemia   . Hypertension   . Osteoporosis    Past Surgical History:  Procedure Laterality Date  . MOHS SURGERY  07/2014    BCCA on nose  . TONSILLECTOMY  1952  . vaginal hysterectomy with anterior repair  1990   Family History  Problem Relation Age of Onset  . Stroke Father   . Kidney failure Father   . Cancer Mother        question of rectum/vaginal  . Heart disease Brother        x4  . Stroke Sister   . Dementia Sister   . Pneumonia Sister   . Breast cancer Neg Hx   . Colon cancer Neg Hx    Social History   Socioeconomic History  . Marital status: Widowed    Spouse name: Not on file  . Number of children: 2  . Years of education: Not on file  . Highest education level: Not on file  Occupational History  . Not on file  Tobacco Use  . Smoking status: Never Smoker  . Smokeless tobacco: Never Used  Substance and Sexual Activity  . Alcohol use: No    Alcohol/week: 0.0 standard drinks  . Drug use: No  . Sexual activity: Never  Other Topics Concern  . Not on file  Social History Narrative  . Not on file   Social Determinants of Health   Financial Resource Strain:   . Difficulty of Paying Living Expenses:   Food Insecurity:   . Worried About Charity fundraiser in  the Last Year:   . [place] in the Last Year:   Transportation Needs:   . Film/video editor (Medical):   Marland Kitchen Lack of Transportation (Non-Medical):   Physical Activity:   . Days of Exercise per Week:   . Minutes of Exercise per Session:   Stress:   . Feeling of Stress :   Social Connections:   . Frequency of Communication with Friends and Family:   . Frequency of Social Gatherings with Friends and Family:   . Attends Religious Services:   . Active Member of Clubs or Organizations:   . Attends Archivist Meetings:   Marland Kitchen Marital Status:     Outpatient Encounter Medications as of 02/23/2020  Medication Sig  . ammonium lactate (LAC-HYDRIN) 12 % lotion Apply 1 application topically as needed for dry skin. Apply to arms as needed  . Calcium Carbonate-Vitamin D (CALCIUM 600+D) 600-400 MG-UNIT per tablet  Take 1 tablet by mouth daily.  . ferrous sulfate 325 (65 FE) MG tablet TAKE 1 TABLET BY MOUTH DAILY  . fish oil-omega-3 fatty acids 1000 MG capsule Take 1 g by mouth daily.  Marland Kitchen glucose blood (BAYER CONTOUR NEXT TEST) test strip TEST ONCE DAILY  . latanoprost (XALATAN) 0.005 % ophthalmic solution Place 1 drop into both eyes at bedtime.  . lovastatin (MEVACOR) 10 MG tablet Take 1 tablet by mouth three days per week.  . Multiple Vitamin (MULTIVITAMIN) tablet Take 1 tablet by mouth daily.  Marland Kitchen telmisartan (MICARDIS) 40 MG tablet TAKE 1 TABLET BY MOUTH EVERY DAY  . triamterene-hydrochlorothiazide (MAXZIDE-25) 37.5-25 MG tablet TAKE 1/2 TABLET BY MOUTH EVERY DAY   No facility-administered encounter medications on file as of 02/23/2020.    Activities of Daily Living In your present state of health, do you have any difficulty performing the following activities: 02/23/2020  Hearing? Y  Comment Hearing aid  Vision? N  Difficulty concentrating or making decisions? N  Walking or climbing stairs? N  Dressing or bathing? N  Doing errands, shopping? N  Preparing Food and eating ? N  Using the Toilet? N  In the past six months, have you accidently leaked urine? Y  Comment Managed with daily liner  Do you have problems with loss of bowel control? N  Managing your Medications? N  Managing your Finances? N  Housekeeping or managing your Housekeeping? N  Some recent data might be hidden    Patient Care Team: Einar Pheasant, MD as PCP - General (Internal Medicine)    Assessment:   This is a routine wellness examination for [name].  I connected with Tina Watson today by telephone and verified that I am speaking with the correct person using two identifiers. Location patient: home Location provider: work Persons participating in the virtual visit: patient, Marine scientist.    I discussed the limitations, risks, security and privacy concerns of performing an evaluation and management service by telephone and the  availability of in person appointments.The patient expressed understanding and verbally consented to this telephonic visit.    Interactive audio and video telecommunications were attempted between this provider and patient, however failed, due to patient having technical difficulties OR patient did not have access to video capability.  We continued and completed visit with audio only.  Some vital signs may be absent or patient reported.   Health Maintenance Due: -Tdap vaccine- discussed; to be completed with doctor in visit or local pharmacy. -Dexa Scan- deferred per patient  See completed HM at the end of  note.   Eye: Visual acuity not assessed. Virtual visit. Followed by their ophthalmologist. Visits every 6 months.   Dental: Visits every 6 months.    Hearing: Hearing aids- yes  Safety:  Patient feels safe at home- yes Patient does have smoke detectors at home- yes Patient does wear sunscreen or protective clothing when in direct sunlight - yes Patient does wear seat belt when in a moving vehicle - yes Patient drives- yes Adequate lighting in walkways free from debris- yes Grab bars and handrails used as appropriate- yes Ambulates with an assistive device- no Daughter lives in the home.   Social: Alcohol intake - no    Smoking history- never   Smokers in home? none Illicit drug use? none  Medication: Taking as directed and without issues.  Pill box in use -yes  Self managed - yes   Covid-19: Precautions and sickness symptoms discussed. Wears mask, social distancing, hand hygiene as appropriate.   Activities of Daily Living Patient denies needing assistance with: household chores, feeding themselves, getting from bed to chair, getting to the toilet, bathing/showering, dressing, managing money, or preparing meals.   Discussed the importance of a healthy diet, water intake and the benefits of aerobic exercise.   Physical activity- active around the home, yard work, no  routine.   Diet:  Low cholesterol/low diet Water: good intake Caffeine: 1-2 cups coffee  Other Providers Patient Care Team: Einar Pheasant, MD as PCP - General (Internal Medicine) Exercise Activities and Dietary recommendations Current Exercise Habits: The patient does not participate in regular exercise at present  Goals    . Healthy Lifestyle     Walk for exercise  Stay hydrated!  Drink plenty of water Low carb/low cholesterol foods        Fall Risk Fall Risk  02/23/2020 06/10/2019 02/20/2019 12/03/2017 11/30/2016  Falls in the past year? 0 0 0 No No  Number falls in past yr: 0 - - - -  Risk for fall due to : - Impaired vision - - -  Follow up Falls evaluation completed Falls evaluation completed - - -   Is the patient's home free of loose throw rugs in walkways, pet beds, electrical cords, etc? Yes      Handrails on the stairs?  Yes      Adequate lighting?  Yes  Timed Get Up and Go performed: No, virtual visit  Depression Screen PHQ 2/9 Scores 02/23/2020 02/20/2019 12/03/2017 11/30/2016  PHQ - 2 Score 0 0 0 0     Cognitive Function  Patient is alert and oriented x3. Patient denies difficulty focusing or concentrating. Patient likes to read and uses the computer for brain health.  MMSE - Mini Mental State Exam 11/30/2015  Orientation to time 5  Orientation to Place 5  Registration 3  Attention/ Calculation 5  Recall 3  Language- name 2 objects 2  Language- repeat 1  Language- follow 3 step command 3  Language- read & follow direction 1  Write a sentence 1  Copy design 1  Total score 30     6CIT Screen 02/23/2020 02/20/2019 12/03/2017 11/30/2016  What Year? 0 points 0 points 0 points 0 points  What month? 0 points 0 points 0 points 0 points  What time? - 0 points 0 points 0 points  Count back from 20 - 0 points 0 points 0 points  Months in reverse 0 points 0 points 0 points 0 points  Repeat phrase - - - 0 points  Total  Score - - - 0    Immunization History    Administered Date(s) Administered  . Fluad Quad(high Dose 65+) 06/10/2019  . Influenza Split 06/13/2012, 07/04/2012  . Influenza, High Dose Seasonal PF 08/21/2016, 07/03/2017, 07/19/2018  . Influenza,inj,Quad PF,6+ Mos 06/25/2013, 06/08/2014, 06/02/2015  . Influenza-Unspecified 07/04/2012, 06/25/2013, 06/08/2014  . PFIZER SARS-COV-2 Vaccination 09/13/2019, 10/24/2019  . Pneumococcal Conjugate-13 12/25/2016  . Pneumococcal Polysaccharide-23 01/01/2018   Screening Tests Health Maintenance  Topic Date Due  . TETANUS/TDAP  Never done  . DEXA SCAN  Never done  . MAMMOGRAM  12/25/2036 (Originally 07/03/2015)  . INFLUENZA VACCINE  04/11/2020  . FOOT EXAM  06/09/2020  . HEMOGLOBIN A1C  06/29/2020  . OPHTHALMOLOGY EXAM  02/17/2021  . COVID-19 Vaccine  Completed  . PNA vac Low Risk Adult  Completed    Cancer Screenings: Lung: Low Dose CT Chest recommended if Age 21-80 years, 30 pack-year currently smoking OR have quit w/in 15years. Patient does not qualify.     Plan:   Keep all routine maintenance appointments.   Follow up 04/13/20   Medicare Attestation I have personally reviewed: The patient's medical and social history Their use of alcohol, tobacco or illicit drugs Their current medications and supplements The patient's functional ability including ADLs,fall risks, home safety risks, cognitive, and hearing and visual impairment Diet and physical activities Evidence for depression   I have reviewed and discussed with patient certain preventive protocols, quality metrics, and best practice recommendations.      Varney Biles, LPN  [id number]/53/7943

## 2020-02-23 NOTE — Progress Notes (Signed)
I have reviewed the above note and agree.  Erbie Arment, M.D.  

## 2020-03-28 ENCOUNTER — Other Ambulatory Visit: Payer: Self-pay | Admitting: Internal Medicine

## 2020-04-13 ENCOUNTER — Other Ambulatory Visit: Payer: Self-pay

## 2020-04-13 ENCOUNTER — Ambulatory Visit (INDEPENDENT_AMBULATORY_CARE_PROVIDER_SITE_OTHER): Payer: Medicare Other | Admitting: Internal Medicine

## 2020-04-13 ENCOUNTER — Encounter: Payer: Self-pay | Admitting: Internal Medicine

## 2020-04-13 DIAGNOSIS — I10 Essential (primary) hypertension: Secondary | ICD-10-CM

## 2020-04-13 DIAGNOSIS — D649 Anemia, unspecified: Secondary | ICD-10-CM

## 2020-04-13 DIAGNOSIS — E1165 Type 2 diabetes mellitus with hyperglycemia: Secondary | ICD-10-CM | POA: Diagnosis not present

## 2020-04-13 DIAGNOSIS — E7800 Pure hypercholesterolemia, unspecified: Secondary | ICD-10-CM

## 2020-04-13 NOTE — Progress Notes (Signed)
Patient ID: Tina Watson, female   DOB: 1927/12/07, 86 y.o.   MRN: [id number]   Subjective:    Patient ID: Tina Watson, female    DOB: [birth year], 86 y.o.   MRN: [id number]  HPI This visit occurred during the SARS-CoV-2 public health emergency.  Safety protocols were in place, including screening questions prior to the visit, additional usage of staff PPE, and extensive cleaning of exam room while observing appropriate contact time as indicated for disinfecting solutions  Patient here for a scheduled follow up.  Last visit, a1c 7.1.  Declined medication. Wanted to work on diet and exercise.  Last check in April 6.9.  Stays active.  No chest pain or sob.  No acid reflux.  No abdominal pain.  Bowels moving.  Blood pressures [id number] systolic readings.  AM sugars averaging 100-120.  Taking lovastatin 2x/week.  Some soreness in her upper thigh, but feels she is tolerating.  Does not want to increase the dose.    Past Medical History:  Diagnosis Date  . Anemia   . Cancer (HCC)    Skin  . Chicken pox   . Colon polyps   . Diabetes mellitus without complication ([place])   . Diverticulosis   . Granuloma annulare   . Hypercholesterolemia   . Hypertension   . Osteoporosis    Past Surgical History:  Procedure Laterality Date  . MOHS SURGERY  07/2014   BCCA on nose  . TONSILLECTOMY  1952  . vaginal hysterectomy with anterior repair  1990   Family History  Problem Relation Age of Onset  . Stroke Father   . Kidney failure Father   . Cancer Mother        question of rectum/vaginal  . Heart disease Brother        x4  . Stroke Sister   . Dementia Sister   . Pneumonia Sister   . Breast cancer Neg Hx   . Colon cancer Neg Hx    Social History   Socioeconomic History  . Marital status: Widowed    Spouse name: Not on file  . Number of children: 2  . Years of education: Not on file  . Highest education level: Not on file  Occupational History  . Not on file  Tobacco Use  . Smoking status:  Never Smoker  . Smokeless tobacco: Never Used  Substance and Sexual Activity  . Alcohol use: No    Alcohol/week: 0.0 standard drinks  . Drug use: No  . Sexual activity: Never  Other Topics Concern  . Not on file  Social History Narrative  . Not on file   Social Determinants of Health   Financial Resource Strain:   . Difficulty of Paying Living Expenses: Not on file  Food Insecurity:   . Worried About Charity fundraiser in the Last Year: Not on file  . Ran Out of Food in the Last Year: Not on file  Transportation Needs:   . Lack of Transportation (Medical): Not on file  . Lack of Transportation (Non-Medical): Not on file  Physical Activity:   . Days of Exercise per Week: Not on file  . Minutes of Exercise per Session: Not on file  Stress:   . Feeling of Stress : Not on file  Social Connections:   . Frequency of Communication with Friends and Family: Not on file  . Frequency of Social Gatherings with Friends and Family: Not on file  . Attends Religious Services: Not on file  .  Active Member of Clubs or Organizations: Not on file  . Attends Archivist Meetings: Not on file  . Marital Status: Not on file    Outpatient Encounter Medications as of 04/13/2020  Medication Sig  . ammonium lactate (LAC-HYDRIN) 12 % lotion Apply 1 application topically as needed for dry skin. Apply to arms as needed  . Calcium Carbonate-Vitamin D (CALCIUM 600+D) 600-400 MG-UNIT per tablet Take 1 tablet by mouth daily.  . ferrous sulfate 325 (65 FE) MG tablet TAKE 1 TABLET BY MOUTH DAILY  . fish oil-omega-3 fatty acids 1000 MG capsule Take 1 g by mouth daily.  Marland Kitchen glucose blood (BAYER CONTOUR NEXT TEST) test strip TEST ONCE DAILY  . latanoprost (XALATAN) 0.005 % ophthalmic solution Place 1 drop into both eyes at bedtime.  . lovastatin (MEVACOR) 10 MG tablet TAKE 1 TABLET BY MOUTH THREE DAYS PER WEEK.  . Multiple Vitamin (MULTIVITAMIN) tablet Take 1 tablet by mouth daily.  Marland Kitchen telmisartan  (MICARDIS) 40 MG tablet TAKE 1 TABLET BY MOUTH EVERY DAY  . triamterene-hydrochlorothiazide (MAXZIDE-25) 37.5-25 MG tablet TAKE 1/2 TABLET BY MOUTH EVERY DAY   No facility-administered encounter medications on file as of 04/13/2020.    Review of Systems  Constitutional: Negative for appetite change and unexpected weight change.  HENT: Negative for congestion and sinus pressure.   Respiratory: Negative for cough, chest tightness and shortness of breath.   Cardiovascular: Negative for chest pain, palpitations and leg swelling.  Gastrointestinal: Negative for abdominal pain, diarrhea, nausea and vomiting.  Genitourinary: Negative for difficulty urinating and dysuria.  Musculoskeletal: Negative for joint swelling and myalgias.  Skin: Negative for color change and rash.  Neurological: Negative for dizziness, light-headedness and headaches.  Psychiatric/Behavioral: Negative for agitation and dysphoric mood.       Objective:    Physical Exam Vitals reviewed.  Constitutional:      General: She is not in acute distress.    Appearance: Normal appearance.  HENT:     Head: Normocephalic and atraumatic.     Right Ear: External ear normal.     Left Ear: External ear normal.  Eyes:     General: No scleral icterus.       Right eye: No discharge.        Left eye: No discharge.     Conjunctiva/sclera: Conjunctivae normal.  Neck:     Thyroid: No thyromegaly.  Cardiovascular:     Rate and Rhythm: Normal rate and regular rhythm.  Pulmonary:     Effort: No respiratory distress.     Breath sounds: Normal breath sounds. No wheezing.  Abdominal:     General: Bowel sounds are normal.     Palpations: Abdomen is soft.     Tenderness: There is no abdominal tenderness.  Musculoskeletal:        General: No swelling or tenderness.     Cervical back: Neck supple. No tenderness.  Lymphadenopathy:     Cervical: No cervical adenopathy.  Skin:    Findings: No erythema or rash.  Neurological:      Mental Status: She is alert.  Psychiatric:        Mood and Affect: Mood normal.        Behavior: Behavior normal.     BP 138/68   Pulse 95   Temp 97.8 F (36.6 C)   Resp 16   Ht 5' 3"  (1.6 m)   Wt 160 lb (72.6 kg)   LMP 09/13/1984   SpO2 98%   BMI  28.34 kg/m  Wt Readings from Last 3 Encounters:  04/13/20 160 lb (72.6 kg)  02/23/20 160 lb (72.6 kg)  12/08/19 160 lb (72.6 kg)     Lab Results  Component Value Date   WBC 8.3 06/27/2019   HGB 12.9 06/27/2019   HCT 38.6 06/27/2019   PLT 233.0 06/27/2019   GLUCOSE 117 (H) 12/29/2019   CHOL 303 (H) 12/29/2019   TRIG 394.0 (H) 12/29/2019   HDL 61.70 12/29/2019   LDLDIRECT 171.0 12/29/2019   ALT 18 12/29/2019   AST 16 12/29/2019   NA 138 12/29/2019   K 3.9 12/29/2019   CL 99 12/29/2019   CREATININE 1.01 12/29/2019   BUN 25 (H) 12/29/2019   CO2 32 12/29/2019   TSH 3.02 06/27/2019   HGBA1C 6.9 (H) 12/29/2019   MICROALBUR 0.7 06/27/2019         Assessment & Plan:   Problem List Items Addressed This Visit    Hypertension    Blood pressure on recheck improved.  Continue triam/hctz and micardis.  Follow pressures.  Follow metabolic panel.       Relevant Orders   Basic metabolic panel   Hypercholesterolemia    She is on lovastatin 2x/week.  Tolerating as outlined.  Low cholesterol diet and exercise.  Follow lipid apnel.       Relevant Orders   Hepatic function panel   Lipid panel   Diabetes (HCC)    a1c 6.9.  Low carb diet and exercise.  Follow met b and a1c.       Relevant Orders   Hemoglobin A1c   Anemia    Follow cbc.           Einar Pheasant, MD

## 2020-05-01 NOTE — Assessment & Plan Note (Signed)
Blood pressure on recheck improved.  Continue triam/hctz and micardis.  Follow pressures.  Follow metabolic panel.

## 2020-05-01 NOTE — Assessment & Plan Note (Signed)
Follow cbc.  

## 2020-05-01 NOTE — Assessment & Plan Note (Signed)
She is on lovastatin 2x/week.  Tolerating as outlined.  Low cholesterol diet and exercise.  Follow lipid apnel.

## 2020-05-01 NOTE — Assessment & Plan Note (Signed)
a1c 6.9.  Low carb diet and exercise.  Follow met b and a1c.

## 2020-05-11 ENCOUNTER — Other Ambulatory Visit (INDEPENDENT_AMBULATORY_CARE_PROVIDER_SITE_OTHER): Payer: Medicare Other

## 2020-05-11 ENCOUNTER — Other Ambulatory Visit: Payer: Self-pay

## 2020-05-11 DIAGNOSIS — I10 Essential (primary) hypertension: Secondary | ICD-10-CM | POA: Diagnosis not present

## 2020-05-11 DIAGNOSIS — E7800 Pure hypercholesterolemia, unspecified: Secondary | ICD-10-CM | POA: Diagnosis not present

## 2020-05-11 DIAGNOSIS — E1165 Type 2 diabetes mellitus with hyperglycemia: Secondary | ICD-10-CM

## 2020-05-11 LAB — LIPID PANEL
Cholesterol: 309 mg/dL — ABNORMAL HIGH (ref 0–200)
HDL: 53.8 mg/dL (ref >39.00)
NonHDL: 255.23
Total CHOL/HDL Ratio: 6
Triglycerides: 370 mg/dL — ABNORMAL HIGH (ref 0.0–149.0)
VLDL: 74 mg/dL — ABNORMAL HIGH (ref 0.0–40.0)

## 2020-05-11 LAB — BASIC METABOLIC PANEL
BUN: 28 mg/dL — ABNORMAL HIGH (ref 6–23)
CO2: 31 mEq/L (ref 19–32)
Calcium: 9.6 mg/dL (ref 8.4–10.5)
Chloride: 100 mEq/L (ref 96–112)
Creatinine, Ser: 1.07 mg/dL (ref 0.40–1.20)
GFR: 48.1 mL/min — ABNORMAL LOW (ref >60.00)
Glucose, Bld: 110 mg/dL — ABNORMAL HIGH (ref 70–99)
Potassium: 3.7 mEq/L (ref 3.5–5.1)
Sodium: 139 mEq/L (ref 135–145)

## 2020-05-11 LAB — LDL CHOLESTEROL, DIRECT: Direct LDL: 190 mg/dL

## 2020-05-11 LAB — HEMOGLOBIN A1C: Hgb A1c MFr Bld: 7 % — ABNORMAL HIGH (ref 4.6–6.5)

## 2020-05-11 LAB — HEPATIC FUNCTION PANEL
ALT: 17 U/L (ref 0–35)
AST: 17 U/L (ref 0–37)
Albumin: 4.3 g/dL (ref 3.5–5.2)
Alkaline Phosphatase: 63 U/L (ref 39–117)
Bilirubin, Direct: 0.1 mg/dL (ref 0.0–0.3)
Total Bilirubin: 0.6 mg/dL (ref 0.2–1.2)
Total Protein: 7 g/dL (ref 6.0–8.3)

## 2020-06-16 ENCOUNTER — Other Ambulatory Visit: Payer: Self-pay | Admitting: Internal Medicine

## 2020-07-14 DIAGNOSIS — Z23 Encounter for immunization: Secondary | ICD-10-CM | POA: Diagnosis not present

## 2020-07-15 DIAGNOSIS — Z85828 Personal history of other malignant neoplasm of skin: Secondary | ICD-10-CM | POA: Diagnosis not present

## 2020-07-15 DIAGNOSIS — L570 Actinic keratosis: Secondary | ICD-10-CM | POA: Diagnosis not present

## 2020-07-15 DIAGNOSIS — L578 Other skin changes due to chronic exposure to nonionizing radiation: Secondary | ICD-10-CM | POA: Diagnosis not present

## 2020-07-15 DIAGNOSIS — L918 Other hypertrophic disorders of the skin: Secondary | ICD-10-CM | POA: Diagnosis not present

## 2020-07-15 DIAGNOSIS — L821 Other seborrheic keratosis: Secondary | ICD-10-CM | POA: Diagnosis not present

## 2020-07-15 DIAGNOSIS — Z872 Personal history of diseases of the skin and subcutaneous tissue: Secondary | ICD-10-CM | POA: Diagnosis not present

## 2020-07-28 ENCOUNTER — Other Ambulatory Visit: Payer: Self-pay | Admitting: Internal Medicine

## 2020-08-12 DIAGNOSIS — H401131 Primary open-angle glaucoma, bilateral, mild stage: Secondary | ICD-10-CM | POA: Diagnosis not present

## 2020-08-18 ENCOUNTER — Encounter: Payer: Self-pay | Admitting: Internal Medicine

## 2020-08-18 ENCOUNTER — Other Ambulatory Visit: Payer: Self-pay

## 2020-08-18 ENCOUNTER — Ambulatory Visit (INDEPENDENT_AMBULATORY_CARE_PROVIDER_SITE_OTHER): Payer: Medicare Other | Admitting: Internal Medicine

## 2020-08-18 VITALS — BP 138/50 | HR 92 | Temp 97.5°F | Ht 62.99 in | Wt 157.6 lb

## 2020-08-18 DIAGNOSIS — I10 Essential (primary) hypertension: Secondary | ICD-10-CM

## 2020-08-18 DIAGNOSIS — E1165 Type 2 diabetes mellitus with hyperglycemia: Secondary | ICD-10-CM

## 2020-08-18 DIAGNOSIS — E7800 Pure hypercholesterolemia, unspecified: Secondary | ICD-10-CM | POA: Diagnosis not present

## 2020-08-18 DIAGNOSIS — D649 Anemia, unspecified: Secondary | ICD-10-CM

## 2020-08-18 LAB — MICROALBUMIN / CREATININE URINE RATIO
Creatinine,U: 102 mg/dL
Microalb Creat Ratio: 1.1 mg/g (ref 0.0–30.0)
Microalb, Ur: 1.1 mg/dL (ref 0.0–1.9)

## 2020-08-18 LAB — LIPID PANEL
Cholesterol: 346 mg/dL — ABNORMAL HIGH (ref 0–200)
HDL: 62.2 mg/dL (ref >39.00)
Total CHOL/HDL Ratio: 6
Triglycerides: 491 mg/dL — ABNORMAL HIGH (ref 0.0–149.0)

## 2020-08-18 LAB — HEPATIC FUNCTION PANEL
ALT: 16 U/L (ref 0–35)
AST: 14 U/L (ref 0–37)
Albumin: 4.4 g/dL (ref 3.5–5.2)
Alkaline Phosphatase: 75 U/L (ref 39–117)
Bilirubin, Direct: 0.1 mg/dL (ref 0.0–0.3)
Total Bilirubin: 0.5 mg/dL (ref 0.2–1.2)
Total Protein: 7.1 g/dL (ref 6.0–8.3)

## 2020-08-18 LAB — CBC WITH DIFFERENTIAL/PLATELET
Basophils Absolute: 0.1 10*3/uL (ref 0.0–0.1)
Basophils Relative: 0.6 % (ref 0.0–3.0)
Eosinophils Absolute: 0.3 10*3/uL (ref 0.0–0.7)
Eosinophils Relative: 3.1 % (ref 0.0–5.0)
HCT: 40.6 % (ref 36.0–46.0)
Hemoglobin: 13.6 g/dL (ref 12.0–15.0)
Lymphocytes Relative: 39.5 % (ref 12.0–46.0)
Lymphs Abs: 3.3 10*3/uL (ref 0.7–4.0)
MCHC: 33.4 g/dL (ref 30.0–36.0)
MCV: 88.5 fl (ref 78.0–100.0)
Monocytes Absolute: 0.8 10*3/uL (ref 0.1–1.0)
Monocytes Relative: 10.1 % (ref 3.0–12.0)
Neutro Abs: 3.9 10*3/uL (ref 1.4–7.7)
Neutrophils Relative %: 46.7 % (ref 43.0–77.0)
Platelets: 260 10*3/uL (ref 150.0–400.0)
RBC: 4.58 Mil/uL (ref 3.87–5.11)
RDW: 13.7 % (ref 11.5–15.5)
WBC: 8.4 10*3/uL (ref 4.0–10.5)

## 2020-08-18 LAB — TSH: TSH: 2.98 u[IU]/mL (ref 0.35–4.50)

## 2020-08-18 LAB — BASIC METABOLIC PANEL
BUN: 25 mg/dL — ABNORMAL HIGH (ref 6–23)
CO2: 32 mEq/L (ref 19–32)
Calcium: 9.9 mg/dL (ref 8.4–10.5)
Chloride: 99 mEq/L (ref 96–112)
Creatinine, Ser: 1.04 mg/dL (ref 0.40–1.20)
GFR: 47.18 mL/min — ABNORMAL LOW (ref >60.00)
Glucose, Bld: 128 mg/dL — ABNORMAL HIGH (ref 70–99)
Potassium: 4.1 mEq/L (ref 3.5–5.1)
Sodium: 139 mEq/L (ref 135–145)

## 2020-08-18 LAB — LDL CHOLESTEROL, DIRECT: Direct LDL: 181 mg/dL

## 2020-08-18 LAB — HEMOGLOBIN A1C: Hgb A1c MFr Bld: 6.9 % — ABNORMAL HIGH (ref 4.6–6.5)

## 2020-08-18 NOTE — Progress Notes (Signed)
Patient ID: Tina Watson, female   DOB: 08-11-1928, 86 y.o.   MRN: [id number]   Subjective:    Patient ID: Tina Watson, female    DOB: 08-11-1928, 86 y.o.   MRN: [id number]  HPI This visit occurred during the SARS-CoV-2 public health emergency.  Safety protocols were in place, including screening questions prior to the visit, additional usage of staff PPE, and extensive cleaning of exam room while observing appropriate contact time as indicated for disinfecting solutions.  Patient here for a scheduled follow up. Here to follow up regarding her blood pressure and cholesterol.  She is doing well.  Feels good.  Stays active.  No chest pain or sob.  No acid reflux.  No abdominal pain.  Bowels moving.  Was crawling around on the floor.  Aggravated her right knee.  Is better now.  Does not limit her activity.  Discussed diet and exercise.  Has intolerance to cholesterol medication.  Has stopped.     Past Medical History:  Diagnosis Date  . Anemia   . Cancer (HCC)    Skin  . Chicken pox   . Colon polyps   . Diabetes mellitus without complication ([place])   . Diverticulosis   . Granuloma annulare   . Hypercholesterolemia   . Hypertension   . Osteoporosis    Past Surgical History:  Procedure Laterality Date  . MOHS SURGERY  07/2014   BCCA on nose  . TONSILLECTOMY  1952  . vaginal hysterectomy with anterior repair  1990   Family History  Problem Relation Age of Onset  . Stroke Father   . Kidney failure Father   . Cancer Mother        question of rectum/vaginal  . Heart disease Brother        x4  . Stroke Sister   . Dementia Sister   . Pneumonia Sister   . Breast cancer Neg Hx   . Colon cancer Neg Hx    Social History   Socioeconomic History  . Marital status: Widowed    Spouse name: Not on file  . Number of children: 2  . Years of education: Not on file  . Highest education level: Not on file  Occupational History  . Not on file  Tobacco Use  . Smoking status: Never Smoker  .  Smokeless tobacco: Never Used  Substance and Sexual Activity  . Alcohol use: No    Alcohol/week: 0.0 standard drinks  . Drug use: No  . Sexual activity: Never  Other Topics Concern  . Not on file  Social History Narrative  . Not on file   Social Determinants of Health   Financial Resource Strain: Not on file  Food Insecurity: Not on file  Transportation Needs: Not on file  Physical Activity: Not on file  Stress: Not on file  Social Connections: Not on file    Outpatient Encounter Medications as of 08/18/2020  Medication Sig  . ammonium lactate (LAC-HYDRIN) 12 % lotion Apply 1 application topically as needed for dry skin. Apply to arms as needed  . Calcium Carbonate-Vitamin D (CALCIUM 600+D) 600-400 MG-UNIT per tablet Take 1 tablet by mouth daily.  . ferrous sulfate 325 (65 FE) MG tablet TAKE 1 TABLET BY MOUTH DAILY  . fish oil-omega-3 fatty acids 1000 MG capsule Take 1 g by mouth daily.  Marland Kitchen glucose blood (BAYER CONTOUR NEXT TEST) test strip TEST ONCE DAILY  . latanoprost (XALATAN) 0.005 % ophthalmic solution Place 1 drop into both  eyes at bedtime.  . Multiple Vitamin (MULTIVITAMIN) tablet Take 1 tablet by mouth daily.  Marland Kitchen telmisartan (MICARDIS) 40 MG tablet TAKE 1 TABLET BY MOUTH EVERY DAY  . triamterene-hydrochlorothiazide (MAXZIDE-25) 37.5-25 MG tablet TAKE 1/2 TABLET BY MOUTH EVERY DAY  . lovastatin (MEVACOR) 10 MG tablet TAKE 1 TABLET BY MOUTH THREE DAYS PER WEEK. (Patient not taking: Reported on 08/18/2020)   No facility-administered encounter medications on file as of 08/18/2020.    Review of Systems  Constitutional: Negative for appetite change and unexpected weight change.  HENT: Negative for congestion and sinus pressure.   Respiratory: Negative for cough, chest tightness and shortness of breath.   Cardiovascular: Negative for chest pain, palpitations and leg swelling.  Gastrointestinal: Negative for abdominal pain, diarrhea, nausea and vomiting.  Genitourinary:  Negative for difficulty urinating and dysuria.  Musculoskeletal: Negative for joint swelling and myalgias.  Neurological: Negative for dizziness, light-headedness and headaches.  Psychiatric/Behavioral: Negative for agitation and dysphoric mood.       Objective:    Physical Exam Vitals reviewed.  Constitutional:      General: She is not in acute distress.    Appearance: Normal appearance.  HENT:     Head: Normocephalic and atraumatic.     Right Ear: External ear normal.     Left Ear: External ear normal.     Mouth/Throat:     Mouth: Oropharynx is clear and moist.  Eyes:     General: No scleral icterus.       Right eye: No discharge.        Left eye: No discharge.     Conjunctiva/sclera: Conjunctivae normal.  Neck:     Thyroid: No thyromegaly.  Cardiovascular:     Rate and Rhythm: Normal rate and regular rhythm.  Pulmonary:     Effort: No respiratory distress.     Breath sounds: Normal breath sounds. No wheezing.  Abdominal:     General: Bowel sounds are normal.     Palpations: Abdomen is soft.     Tenderness: There is no abdominal tenderness.  Musculoskeletal:        General: No swelling, tenderness or edema.     Cervical back: Neck supple. No tenderness.  Lymphadenopathy:     Cervical: No cervical adenopathy.  Skin:    Findings: No erythema or rash.  Neurological:     Mental Status: She is alert.  Psychiatric:        Mood and Affect: Mood normal.        Behavior: Behavior normal.     BP (!) 138/50 (BP Location: Left Arm, Patient Position: Sitting)   Pulse 92   Temp (!) 97.5 F (36.4 C)   Ht 5' 2.99" (1.6 m)   Wt 157 lb 9.6 oz (71.5 kg)   LMP 09/13/1984   SpO2 96%   BMI 27.92 kg/m  Wt Readings from Last 3 Encounters:  08/18/20 157 lb 9.6 oz (71.5 kg)  04/13/20 160 lb (72.6 kg)  02/23/20 160 lb (72.6 kg)     Lab Results  Component Value Date   WBC 8.4 08/18/2020   HGB 13.6 08/18/2020   HCT 40.6 08/18/2020   PLT 260.0 08/18/2020   GLUCOSE 128  (H) 08/18/2020   CHOL 346 (H) 08/18/2020   TRIG 491.0 (H) 08/18/2020   HDL 62.20 08/18/2020   LDLDIRECT 181.0 08/18/2020   ALT 16 08/18/2020   AST 14 08/18/2020   NA 139 08/18/2020   K 4.1 08/18/2020   CL 99 08/18/2020  CREATININE 1.04 08/18/2020   BUN 25 (H) 08/18/2020   CO2 32 08/18/2020   TSH 2.98 08/18/2020   HGBA1C 6.9 (H) 08/18/2020   MICROALBUR 1.1 08/18/2020        Assessment & Plan:   Problem List Items Addressed This Visit    Hypertension - Primary    Blood pressure as outlined.  Continue triam/hctz and micardis.  Follow pressures.  Follow metabolic panel.       Relevant Orders   CBC with Differential/Platelet (Completed)   TSH (Completed)   Hypercholesterolemia    Off lovastatin now.  Intolerance to multiple statin medication.  Discussed diet and exercise.  Recheck lipid panel today.       Relevant Orders   Lipid panel (Completed)   Hepatic function panel (Completed)   Diabetes ([place])    Low carb diet and exercise. Follow met b and a1c.       Relevant Orders   Hemoglobin A1c (Completed)   Basic metabolic panel (Completed)   Microalbumin / creatinine urine ratio (Completed)   Anemia    Follow cbc.           Einar Pheasant, MD

## 2020-08-23 ENCOUNTER — Encounter: Payer: Self-pay | Admitting: Internal Medicine

## 2020-08-23 NOTE — Assessment & Plan Note (Signed)
Off lovastatin now.  Intolerance to multiple statin medication.  Discussed diet and exercise.  Recheck lipid panel today.

## 2020-08-23 NOTE — Assessment & Plan Note (Signed)
Follow cbc.  

## 2020-08-23 NOTE — Assessment & Plan Note (Signed)
Blood pressure as outlined.  Continue triam/hctz and micardis.  Follow pressures. Follow metabolic panel.   

## 2020-08-23 NOTE — Assessment & Plan Note (Signed)
Low carb diet and exercise. Follow met b and a1c.  

## 2020-09-28 ENCOUNTER — Other Ambulatory Visit: Payer: Self-pay | Admitting: Internal Medicine

## 2020-12-14 ENCOUNTER — Other Ambulatory Visit: Payer: Self-pay

## 2020-12-14 ENCOUNTER — Ambulatory Visit (INDEPENDENT_AMBULATORY_CARE_PROVIDER_SITE_OTHER): Payer: Medicare Other | Admitting: Internal Medicine

## 2020-12-14 ENCOUNTER — Encounter: Payer: Self-pay | Admitting: Internal Medicine

## 2020-12-14 VITALS — BP 118/68 | HR 84 | Temp 97.4°F | Ht 63.0 in | Wt 160.0 lb

## 2020-12-14 DIAGNOSIS — E7800 Pure hypercholesterolemia, unspecified: Secondary | ICD-10-CM | POA: Diagnosis not present

## 2020-12-14 DIAGNOSIS — D649 Anemia, unspecified: Secondary | ICD-10-CM | POA: Diagnosis not present

## 2020-12-14 DIAGNOSIS — E1165 Type 2 diabetes mellitus with hyperglycemia: Secondary | ICD-10-CM | POA: Diagnosis not present

## 2020-12-14 DIAGNOSIS — I10 Essential (primary) hypertension: Secondary | ICD-10-CM

## 2020-12-14 LAB — HM DIABETES FOOT EXAM

## 2020-12-14 NOTE — Progress Notes (Signed)
Patient ID: Tina Watson, female   DOB: 15-Sep-1927, 87 y.o.   MRN: [id number]   Subjective:    Patient ID: Tina Watson, female    DOB: [birth year], 87 y.o.   MRN: [id number]  HPI This visit occurred during the SARS-CoV-2 public health emergency.  Safety protocols were in place, including screening questions prior to the visit, additional usage of staff PPE, and extensive cleaning of exam room while observing appropriate contact time as indicated for disinfecting solutions.  Patient here for a scheduled follow up. Here to follow up regarding her blood pressure and cholesterol.  She is doing well. Stays active.  No chest pain or increased sob reported.  Taking micardis q day. Feels better on this dose.  No acid reflux.  No abdominal pain.  Bowels moving.  Blood sugars in am averaging 130s.  Blood pressure doing well.   Past Medical History:  Diagnosis Date  . Anemia   . Cancer (HCC)    Skin  . Chicken pox   . Colon polyps   . Diabetes mellitus without complication ([place])   . Diverticulosis   . Granuloma annulare   . Hypercholesterolemia   . Hypertension   . Osteoporosis    Past Surgical History:  Procedure Laterality Date  . MOHS SURGERY  07/2014   BCCA on nose  . TONSILLECTOMY  1952  . vaginal hysterectomy with anterior repair  1990   Family History  Problem Relation Age of Onset  . Stroke Father   . Kidney failure Father   . Cancer Mother        question of rectum/vaginal  . Heart disease Brother        x4  . Stroke Sister   . Dementia Sister   . Pneumonia Sister   . Breast cancer Neg Hx   . Colon cancer Neg Hx    Social History   Socioeconomic History  . Marital status: Widowed    Spouse name: Not on file  . Number of children: 2  . Years of education: Not on file  . Highest education level: Not on file  Occupational History  . Not on file  Tobacco Use  . Smoking status: Never Smoker  . Smokeless tobacco: Never Used  Substance and Sexual Activity  . Alcohol use:  No    Alcohol/week: 0.0 standard drinks  . Drug use: No  . Sexual activity: Never  Other Topics Concern  . Not on file  Social History Narrative  . Not on file   Social Determinants of Health   Financial Resource Strain: Not on file  Food Insecurity: Not on file  Transportation Needs: Not on file  Physical Activity: Not on file  Stress: Not on file  Social Connections: Not on file    Outpatient Encounter Medications as of 12/14/2020  Medication Sig  . ammonium lactate (LAC-HYDRIN) 12 % lotion Apply 1 application topically as needed for dry skin. Apply to arms as needed  . Calcium Carbonate-Vitamin D 600-400 MG-UNIT tablet Take 1 tablet by mouth daily.  . ferrous sulfate 325 (65 FE) MG tablet TAKE 1 TABLET BY MOUTH DAILY  . fish oil-omega-3 fatty acids 1000 MG capsule Take 1 g by mouth daily.  Marland Kitchen glucose blood (BAYER CONTOUR NEXT TEST) test strip TEST ONCE DAILY  . latanoprost (XALATAN) 0.005 % ophthalmic solution Place 1 drop into both eyes at bedtime.  . lovastatin (MEVACOR) 10 MG tablet TAKE 1 TABLET BY MOUTH THREE DAYS PER WEEK.  Marland Kitchen  Multiple Vitamin (MULTIVITAMIN) tablet Take 1 tablet by mouth daily.  Marland Kitchen telmisartan (MICARDIS) 40 MG tablet TAKE 1 TABLET BY MOUTH EVERY DAY  . triamterene-hydrochlorothiazide (MAXZIDE-25) 37.5-25 MG tablet TAKE 1/2 TABLET BY MOUTH EVERY DAY   No facility-administered encounter medications on file as of 12/14/2020.    Review of Systems  Constitutional: Negative for appetite change and unexpected weight change.  HENT: Negative for congestion and sinus pressure.   Respiratory: Negative for cough, chest tightness and shortness of breath.   Cardiovascular: Negative for chest pain, palpitations and leg swelling.  Gastrointestinal: Negative for abdominal pain, diarrhea, nausea and vomiting.  Genitourinary: Negative for difficulty urinating and dysuria.  Musculoskeletal: Negative for joint swelling and myalgias.  Skin: Negative for color change and rash.   Neurological: Negative for dizziness, light-headedness and headaches.  Psychiatric/Behavioral: Negative for agitation and dysphoric mood.       Objective:    Physical Exam Vitals reviewed.  Constitutional:      General: She is not in acute distress.    Appearance: Normal appearance.  HENT:     Head: Normocephalic and atraumatic.     Right Ear: External ear normal.     Left Ear: External ear normal.  Eyes:     General: No scleral icterus.       Right eye: No discharge.        Left eye: No discharge.     Conjunctiva/sclera: Conjunctivae normal.  Neck:     Thyroid: No thyromegaly.  Cardiovascular:     Rate and Rhythm: Normal rate and regular rhythm.  Pulmonary:     Effort: No respiratory distress.     Breath sounds: Normal breath sounds. No wheezing.  Abdominal:     General: Bowel sounds are normal.     Palpations: Abdomen is soft.     Tenderness: There is no abdominal tenderness.  Musculoskeletal:        General: No swelling or tenderness.     Cervical back: Neck supple. No tenderness.  Lymphadenopathy:     Cervical: No cervical adenopathy.  Skin:    Findings: No erythema or rash.  Neurological:     Mental Status: She is alert.  Psychiatric:        Mood and Affect: Mood normal.        Behavior: Behavior normal.     BP 118/68   Pulse 84   Temp (!) 97.4 F (36.3 C)   Ht '5\' 3"'  (1.6 m)   Wt 160 lb (72.6 kg)   LMP 09/13/1984   SpO2 95%   BMI 28.34 kg/m  Wt Readings from Last 3 Encounters:  12/14/20 160 lb (72.6 kg)  08/18/20 157 lb 9.6 oz (71.5 kg)  04/13/20 160 lb (72.6 kg)     Lab Results  Component Value Date   WBC 8.4 08/18/2020   HGB 13.6 08/18/2020   HCT 40.6 08/18/2020   PLT 260.0 08/18/2020   GLUCOSE 128 (H) 08/18/2020   CHOL 346 (H) 08/18/2020   TRIG 491.0 (H) 08/18/2020   HDL 62.20 08/18/2020   LDLDIRECT 181.0 08/18/2020   ALT 16 08/18/2020   AST 14 08/18/2020   NA 139 08/18/2020   K 4.1 08/18/2020   CL 99 08/18/2020   CREATININE  1.04 08/18/2020   BUN 25 (H) 08/18/2020   CO2 32 08/18/2020   TSH 2.98 08/18/2020   HGBA1C 6.9 (H) 08/18/2020   MICROALBUR 1.1 08/18/2020        Assessment & Plan:   Problem  List Items Addressed This Visit    Anemia    Follow cbc.  Hgb wnl 08/2020.       Diabetes ([place])    Low carb diet and exercise given elevated sugars.  Follow met b and a1c.   Lab Results  Component Value Date   HGBA1C 6.9 (H) 08/18/2020        Relevant Orders   Hemoglobin T6O   Basic metabolic panel   Hypercholesterolemia    Intolerance to multiple statins.  Per report, taking lovastatin.  Previous note, off lovastatin.  need o clarify if taking. Follow cholesterol.  Low cholesterol diet and exercise.        Relevant Orders   Hepatic function panel   Lipid panel   Hypertension - Primary    Blood pressure as outlined.  Continue triam/hctz and micardis.  Follow pressures. Follow metabolic panel.            Einar Pheasant, MD

## 2020-12-19 ENCOUNTER — Encounter: Payer: Self-pay | Admitting: Internal Medicine

## 2020-12-19 NOTE — Assessment & Plan Note (Addendum)
Low carb diet and exercise given elevated sugars.  Follow met b and a1c.   Lab Results  Component Value Date   HGBA1C 6.9 (H) 08/18/2020

## 2020-12-19 NOTE — Assessment & Plan Note (Addendum)
Follow cbc.  Hgb wnl 08/2020.

## 2020-12-19 NOTE — Assessment & Plan Note (Signed)
Blood pressure as outlined.  Continue triam/hctz and micardis.  Follow pressures. Follow metabolic panel.

## 2020-12-19 NOTE — Assessment & Plan Note (Signed)
Intolerance to multiple statins.  Per report, taking lovastatin.  Previous note, off lovastatin.  need o clarify if taking. Follow cholesterol.  Low cholesterol diet and exercise.

## 2020-12-30 ENCOUNTER — Other Ambulatory Visit (INDEPENDENT_AMBULATORY_CARE_PROVIDER_SITE_OTHER): Payer: Medicare Other

## 2020-12-30 ENCOUNTER — Other Ambulatory Visit: Payer: Self-pay

## 2020-12-30 DIAGNOSIS — E7800 Pure hypercholesterolemia, unspecified: Secondary | ICD-10-CM | POA: Diagnosis not present

## 2020-12-30 DIAGNOSIS — E1165 Type 2 diabetes mellitus with hyperglycemia: Secondary | ICD-10-CM

## 2020-12-30 LAB — LIPID PANEL
Cholesterol: 265 mg/dL — ABNORMAL HIGH (ref 0–200)
HDL: 57.1 mg/dL (ref >39.00)
Total CHOL/HDL Ratio: 5
Triglycerides: 446 mg/dL — ABNORMAL HIGH (ref 0.0–149.0)

## 2020-12-30 LAB — BASIC METABOLIC PANEL
BUN: 25 mg/dL — ABNORMAL HIGH (ref 6–23)
CO2: 31 mEq/L (ref 19–32)
Calcium: 9.6 mg/dL (ref 8.4–10.5)
Chloride: 99 mEq/L (ref 96–112)
Creatinine, Ser: 1.14 mg/dL (ref 0.40–1.20)
GFR: 42.15 mL/min — ABNORMAL LOW (ref >60.00)
Glucose, Bld: 121 mg/dL — ABNORMAL HIGH (ref 70–99)
Potassium: 4.1 mEq/L (ref 3.5–5.1)
Sodium: 138 mEq/L (ref 135–145)

## 2020-12-30 LAB — LDL CHOLESTEROL, DIRECT: Direct LDL: 153 mg/dL

## 2020-12-30 LAB — HEMOGLOBIN A1C: Hgb A1c MFr Bld: 7 % — ABNORMAL HIGH (ref 4.6–6.5)

## 2020-12-30 LAB — HEPATIC FUNCTION PANEL
ALT: 19 U/L (ref 0–35)
AST: 17 U/L (ref 0–37)
Albumin: 4.2 g/dL (ref 3.5–5.2)
Alkaline Phosphatase: 70 U/L (ref 39–117)
Bilirubin, Direct: 0.1 mg/dL (ref 0.0–0.3)
Total Bilirubin: 0.6 mg/dL (ref 0.2–1.2)
Total Protein: 6.9 g/dL (ref 6.0–8.3)

## 2021-02-02 ENCOUNTER — Other Ambulatory Visit: Payer: Self-pay | Admitting: Internal Medicine

## 2021-02-10 DIAGNOSIS — H401131 Primary open-angle glaucoma, bilateral, mild stage: Secondary | ICD-10-CM | POA: Diagnosis not present

## 2021-02-23 ENCOUNTER — Ambulatory Visit (INDEPENDENT_AMBULATORY_CARE_PROVIDER_SITE_OTHER): Payer: Medicare Other

## 2021-02-23 VITALS — BP 127/62 | HR 84 | Ht 63.0 in | Wt 158.0 lb

## 2021-02-23 DIAGNOSIS — Z0000 Encounter for general adult medical examination without abnormal findings: Secondary | ICD-10-CM

## 2021-02-23 NOTE — Progress Notes (Signed)
Subjective:   Tina Watson is a 87 y.o. female who presents for Medicare Annual (Subsequent) preventive examination.  Review of Systems    No ROS.  Medicare Wellness Virtual Visit.  Visual/audio telehealth visit, UTA vital signs.   See social history for additional risk factors.   Cardiac Risk Factors include: advanced age (>79men, >40 women);diabetes mellitus;hypertension     Objective:    Today's Vitals   02/23/21 1122  BP: 127/62  Pulse: 84  Weight: 158 lb (71.7 kg)  Height: 5\' 3"  (1.6 m)   Body mass index is 27.99 kg/m.  Advanced Directives 02/23/2021 02/23/2020 02/20/2019 12/03/2017 11/30/2016 11/30/2015  Does Patient Have a Medical Advance Directive? No Yes Yes Yes Yes Yes  Type of Advance Directive - Reader;Living will Living will;Healthcare Power of Tina Watson;Living will Tina Watson;Living will  Does patient want to make changes to medical advance directive? - No - Patient declined No - Patient declined No - Patient declined No - Patient declined -  Copy of [place] in Chart? - No - copy requested No - copy requested No - copy requested No - copy requested No - copy requested  Would patient like information on creating a medical advance directive? No - Patient declined - - - - -    Current Medications (verified) Outpatient Encounter Medications as of 02/23/2021  Medication Sig   ammonium lactate (LAC-HYDRIN) 12 % lotion Apply 1 application topically as needed for dry skin. Apply to arms as needed   Calcium Carbonate-Vitamin D 600-400 MG-UNIT tablet Take 1 tablet by mouth daily.   ferrous sulfate 325 (65 FE) MG tablet TAKE 1 TABLET BY MOUTH DAILY   fish oil-omega-3 fatty acids 1000 MG capsule Take 1 g by mouth daily.   glucose blood (BAYER CONTOUR NEXT TEST) test strip TEST ONCE DAILY   latanoprost (XALATAN) 0.005 % ophthalmic solution Place 1 drop into both  eyes at bedtime.   lovastatin (MEVACOR) 10 MG tablet TAKE 1 TABLET BY MOUTH THREE DAYS PER WEEK.   Multiple Vitamin (MULTIVITAMIN) tablet Take 1 tablet by mouth daily.   telmisartan (MICARDIS) 40 MG tablet TAKE 1 TABLET BY MOUTH EVERY DAY   triamterene-hydrochlorothiazide (MAXZIDE-25) 37.5-25 MG tablet TAKE 1/2 TABLET BY MOUTH EVERY DAY   No facility-administered encounter medications on file as of 02/23/2021.    Allergies (verified) Demerol [meperidine], Fluvastatin, and Lescol [fluvastatin sodium]   History: Past Medical History:  Diagnosis Date   Anemia    Cancer (Tina Watson)    Skin   Chicken pox    Colon polyps    Diabetes mellitus without complication (Tina)    Diverticulosis    Granuloma annulare    Hypercholesterolemia    Hypertension    Osteoporosis    Past Surgical History:  Procedure Laterality Date   MOHS SURGERY  07/2014   BCCA on nose   TONSILLECTOMY  1952   vaginal hysterectomy with anterior repair  1990   Family History  Problem Relation Age of Onset   Stroke Father    Kidney failure Father    Cancer Mother        question of rectum/vaginal   Heart disease Brother        x4   Stroke Sister    Dementia Sister    Pneumonia Sister    Breast cancer Neg Hx    Colon cancer Neg Hx    Social History  Socioeconomic History   Marital status: Widowed    Spouse name: Not on file   Number of children: 2   Years of education: Not on file   Highest education level: Not on file  Occupational History   Not on file  Tobacco Use   Smoking status: Never   Smokeless tobacco: Never  Substance and Sexual Activity   Alcohol use: No    Alcohol/week: 0.0 standard drinks   Drug use: No   Sexual activity: Never  Other Topics Concern   Not on file  Social History Narrative   Not on file   Social Determinants of Health   Financial Resource Strain: Low Risk    Difficulty of Paying Living Expenses: Not hard at all  Food Insecurity: No Food Insecurity   Worried  About Charity fundraiser in the Last Year: Never true   [place] in the Last Year: Never true  Transportation Needs: No Transportation Needs   Lack of Transportation (Medical): No   Lack of Transportation (Non-Medical): No  Physical Activity: Not on file  Stress: No Stress Concern Present   Feeling of Stress : Not at all  Social Connections: Unknown   Frequency of Communication with Friends and Family: More than three times a week   Frequency of Social Gatherings with Friends and Family: Not on file   Attends Religious Services: Not on file   Active Member of Clubs or Organizations: Not on file   Attends Archivist Meetings: Not on file   Marital Status: Not on file    Tobacco Counseling Counseling given: No   Clinical Intake:  Pre-visit preparation completed: Yes           How often do you need to have someone help you when you read instructions, pamphlets, or other written materials from your doctor or pharmacy?: 1 - Never    Interpreter Needed?: No      Activities of Daily Living In your present state of health, do you have any difficulty performing the following activities: 02/23/2021  Hearing? Y  Vision? N  Difficulty concentrating or making decisions? N  Walking or climbing stairs? N  Comment Paces self  Dressing or bathing? N  Doing errands, shopping? N  Preparing Food and eating ? N  Using the Toilet? N  In the past six months, have you accidently leaked urine? Y  Do you have problems with loss of bowel control? N  Managing your Medications? N  Managing your Finances? N  Housekeeping or managing your Housekeeping? N  Some recent data might be hidden    Patient Care Team: Einar Pheasant, MD as PCP - General (Internal Medicine)  Indicate any recent Medical Services you may have received from other than [facility] providers in the past year (date may be approximate).     Assessment:   This is a routine wellness examination for  Tina Watson.  I connected with Tina Watson today by telephone and verified that I am speaking with the correct person using two identifiers. Location patient: home Location provider: work Persons participating in the virtual visit: patient, Marine scientist.    I discussed the limitations, risks, security and privacy concerns of performing an evaluation and management service by telephone and the availability of in person appointments. The patient expressed understanding and verbally consented to this telephonic visit.    Interactive audio and video telecommunications were attempted between this provider and patient, however failed, due to patient having technical difficulties OR patient  did not have access to video capability.  We continued and completed visit with audio only.  Some vital signs may be absent or patient reported.   Hearing/Vision screen Hearing Screening - Comments:: Hearing aids Vision Screening - Comments:: Wears corrective lenses Visual acuity not assessed, virtual visit.  They have seen their ophthalmologist in the last 12 months.    Dietary issues and exercise activities discussed: Current Exercise Habits: Home exercise routine, Intensity: Mild Healthy diet Good water intake   Goals Addressed             This Visit's Progress    Maintain Healthy Lifestyle       Walk for exercise  Stay hydrated!  Drink plenty of water Low carb/low cholesterol foods         Depression Screen PHQ 2/9 Scores 02/23/2021 02/23/2020 02/20/2019 12/03/2017 11/30/2016 11/30/2015 11/30/2015  PHQ - 2 Score 0 0 0 0 0 0 0    Fall Risk Fall Risk  02/23/2021 02/23/2020 06/10/2019 02/20/2019 12/03/2017  Falls in the past year? 0 0 0 0 No  Number falls in past yr: 0 0 - - -  Injury with Fall? 0 - - - -  Risk for fall due to : - - Impaired vision - -  Follow up Falls evaluation completed Falls evaluation completed Falls evaluation completed - -    ASSISTIVE DEVICES UTILIZED TO PREVENT FALLS: Use of a cane,  walker or w/c? No    TIMED UP AND GO:  Was the test performed? No .   Cognitive Function: MMSE - Mini Mental State Exam 11/30/2015  Orientation to time 5  Orientation to Place 5  Registration 3  Attention/ Calculation 5  Recall 3  Language- name 2 objects 2  Language- repeat 1  Language- follow 3 step command 3  Language- read & follow direction 1  Write a sentence 1  Copy design 1  Total score 30     6CIT Screen 02/23/2021 02/23/2020 02/20/2019 12/03/2017 11/30/2016  What Year? 0 points 0 points 0 points 0 points 0 points  What month? 0 points 0 points 0 points 0 points 0 points  What time? 0 points - 0 points 0 points 0 points  Count back from 20 0 points - 0 points 0 points 0 points  Months in reverse 0 points 0 points 0 points 0 points 0 points  Repeat phrase - - - - 0 points  Total Score - - - - 0    Immunizations Immunization History  Administered Date(s) Administered   Fluad Quad(high Dose 65+) 06/10/2019, 07/14/2020   Influenza Split 06/13/2012, 07/04/2012   Influenza, High Dose Seasonal PF 08/21/2016, 07/03/2017, 07/19/2018   Influenza,inj,Quad PF,6+ Mos 06/25/2013, 06/08/2014, 06/02/2015   Influenza-Unspecified 07/04/2012, 06/25/2013, 06/08/2014   PFIZER(Purple Top)SARS-COV-2 Vaccination 09/13/2019, 10/24/2019, 06/23/2020   Pneumococcal Conjugate-13 12/25/2016   Pneumococcal Polysaccharide-23 01/01/2018    TDAP status: Due, Education has been provided regarding the importance of this vaccine. Advised may receive this vaccine at local pharmacy or Health Dept. Aware to provide a copy of the vaccination record if obtained from local pharmacy or Health Dept. Verbalized acceptance and understanding. Deferred.   Health Maintenance Health Maintenance  Topic Date Due   OPHTHALMOLOGY EXAM  02/17/2021   COVID-19 Vaccine (4 - Booster for Pfizer series) 03/11/2021 (Originally 10/24/2020)   Zoster Vaccines- Shingrix (1 of 2) 05/26/2021 (Originally 09/12/1979)   DEXA SCAN   02/23/2022 (Originally 09/11/1994)   TETANUS/TDAP  02/23/2022 (Originally [birth year]/1948)   MAMMOGRAM  12/25/2036 (Originally 07/03/2015)   INFLUENZA VACCINE  04/11/2021   HEMOGLOBIN A1C  07/01/2021   FOOT EXAM  12/14/2021   PNA vac Low Risk Adult  Completed   HPV VACCINES  Aged Out   Singles vaccine- declined  Bone density- declined   Lung Cancer Screening: (Low Dose CT Chest recommended if Age 52-80 years, 30 pack-year currently smoking OR have quit w/in 15years.) does not qualify.   Hepatitis C Screening: does not qualify  Dental Screening: Recommended annual dental exams for proper oral hygiene  Community Resource Referral / Chronic Care Management: CRR required this visit?  No   CCM required this visit?  No      Plan:    I have personally reviewed and noted the following in the patient's chart:   Medical and social history Use of alcohol, tobacco or illicit drugs  Current medications and supplements including opioid prescriptions. Patient is not currently taking opioid.  Functional ability and status Nutritional status Physical activity Advanced directives List of other physicians Hospitalizations, surgeries, and ER visits in previous 12 months Vitals Screenings to include cognitive, depression, and falls Referrals and appointments  In addition, I have reviewed and discussed with patient certain preventive protocols, quality metrics, and best practice recommendations. A written personalized care plan for preventive services as well as general preventive health recommendations were provided to patient via mail.     Varney Biles, LPN   [id number]/53/2992

## 2021-02-23 NOTE — Patient Instructions (Addendum)
[name] , Thank you for taking time to come for your Medicare Wellness Visit. I appreciate your ongoing commitment to your health goals. Please review the following plan we discussed and let me know if I can assist you in the future.   These are the goals we discussed:  Goals      Maintain Healthy Lifestyle     Walk for exercise  Stay hydrated!  Drink plenty of water Low carb/low cholesterol foods          This is a list of the screening recommended for you and due dates:  Health Maintenance  Topic Date Due   Eye exam for diabetics  02/17/2021   COVID-19 Vaccine (4 - Booster for Pfizer series) 03/11/2021*   Zoster (Shingles) Vaccine (1 of 2) 05/26/2021*   DEXA scan (bone density measurement)  02/23/2022*   Tetanus Vaccine  02/23/2022*   Mammogram  12/25/2036*   Flu Shot  04/11/2021   Hemoglobin A1C  07/01/2021   Complete foot exam   12/14/2021   Pneumonia vaccines  Completed   HPV Vaccine  Aged Out  *Topic was postponed. The date shown is not the original due date.     Conditions/risks identified: none new   Follow up in one year for your annual wellness visit    Preventive Care 65 Years and Older, Female Preventive care refers to lifestyle choices and visits with your health care provider that can promote health and wellness. What does preventive care include? A yearly physical exam. This is also called an annual well check. Dental exams once or twice a year. Routine eye exams. Ask your health care provider how often you should have your eyes checked. Personal lifestyle choices, including: Daily care of your teeth and gums. Regular physical activity. Eating a healthy diet. Avoiding tobacco and drug use. Limiting alcohol use. Practicing safe sex. Taking low-dose aspirin every day. Taking vitamin and mineral supplements as recommended by your health care provider. What happens during an annual well check? The services and screenings done by your health care  provider during your annual well check will depend on your age, overall health, lifestyle risk factors, and family history of disease. Counseling  Your health care provider may ask you questions about your: Alcohol use. Tobacco use. Drug use. Emotional well-being. Home and relationship well-being. Sexual activity. Eating habits. History of falls. Memory and ability to understand (cognition). Work and work Statistician. Reproductive health. Screening  You may have the following tests or measurements: Height, weight, and BMI. Blood pressure. Lipid and cholesterol levels. These may be checked every 5 years, or more frequently if you are over 59 years old. Skin check. Lung cancer screening. You may have this screening every year starting at age 2 if you have a 30-pack-year history of smoking and currently smoke or have quit within the past 15 years. Fecal occult blood test (FOBT) of the stool. You may have this test every year starting at age 26. Flexible sigmoidoscopy or colonoscopy. You may have a sigmoidoscopy every 5 years or a colonoscopy every 10 years starting at age 22. Hepatitis C blood test. Hepatitis B blood test. Sexually transmitted disease (STD) testing. Diabetes screening. This is done by checking your blood sugar (glucose) after you have not eaten for a while (fasting). You may have this done every 1-3 years. Bone density scan. This is done to screen for osteoporosis. You may have this done starting at age 105. Mammogram. This may be done every 1-2 years.  Talk to your health care provider about how often you should have regular mammograms. Talk with your health care provider about your test results, treatment options, and if necessary, the need for more tests. Vaccines  Your health care provider may recommend certain vaccines, such as: Influenza vaccine. This is recommended every year. Tetanus, diphtheria, and acellular pertussis (Tdap, Td) vaccine. You may need a Td  booster every 10 years. Zoster vaccine. You may need this after age 60. Pneumococcal 13-Tina Watson conjugate (PCV13) vaccine. One dose is recommended after age 73. Pneumococcal polysaccharide (PPSV23) vaccine. One dose is recommended after age 79. Talk to your health care provider about which screenings and vaccines you need and how often you need them. This information is not intended to replace advice given to you by your health care provider. Make sure you discuss any questions you have with your health care provider. Document Released: 09/24/2015 Document Revised: 05/17/2016 Document Reviewed: 06/29/2015 Elsevier Interactive Patient Education  2017 [place] Prevention in the Home Falls can cause injuries. They can happen to people of all ages. There are many things you can do to make your home safe and to help prevent falls. What can I do on the outside of my home? Regularly fix the edges of walkways and driveways and fix any cracks. Remove anything that might make you trip as you walk through a door, such as a raised step or threshold. Trim any bushes or trees on the path to your home. Use bright outdoor lighting. Clear any walking paths of anything that might make someone trip, such as rocks or tools. Regularly check to see if handrails are loose or broken. Make sure that both sides of any steps have handrails. Any raised decks and porches should have guardrails on the edges. Have any leaves, snow, or ice cleared regularly. Use sand or salt on walking paths during winter. Clean up any spills in your garage right away. This includes oil or grease spills. What can I do in the bathroom? Use night lights. Install grab bars by the toilet and in the tub and shower. Do not use towel bars as grab bars. Use non-skid mats or decals in the tub or shower. If you need to sit down in the shower, use a plastic, non-slip stool. Keep the floor dry. Clean up any water that spills on the floor  as soon as it happens. Remove soap buildup in the tub or shower regularly. Attach bath mats securely with double-sided non-slip rug tape. Do not have throw rugs and other things on the floor that can make you trip. What can I do in the bedroom? Use night lights. Make sure that you have a light by your bed that is easy to reach. Do not use any sheets or blankets that are too big for your bed. They should not hang down onto the floor. Have a firm chair that has side arms. You can use this for support while you get dressed. Do not have throw rugs and other things on the floor that can make you trip. What can I do in the kitchen? Clean up any spills right away. Avoid walking on wet floors. Keep items that you use a lot in easy-to-reach places. If you need to reach something above you, use a strong step stool that has a grab bar. Keep electrical cords out of the way. Do not use floor polish or wax that makes floors slippery. If you must use wax, use non-skid floor wax.  Do not have throw rugs and other things on the floor that can make you trip. What can I do with my stairs? Do not leave any items on the stairs. Make sure that there are handrails on both sides of the stairs and use them. Fix handrails that are broken or loose. Make sure that handrails are as long as the stairways. Check any carpeting to make sure that it is firmly attached to the stairs. Fix any carpet that is loose or worn. Avoid having throw rugs at the top or bottom of the stairs. If you do have throw rugs, attach them to the floor with carpet tape. Make sure that you have a light switch at the top of the stairs and the bottom of the stairs. If you do not have them, ask someone to add them for you. What else can I do to help prevent falls? Wear shoes that: Do not have high heels. Have rubber bottoms. Are comfortable and fit you well. Are closed at the toe. Do not wear sandals. If you use a stepladder: Make sure that it is  fully opened. Do not climb a closed stepladder. Make sure that both sides of the stepladder are locked into place. Ask someone to hold it for you, if possible. Clearly mark and make sure that you can see: Any grab bars or handrails. First and last steps. Where the edge of each step is. Use tools that help you move around (mobility aids) if they are needed. These include: Canes. Walkers. Scooters. Crutches. Turn on the lights when you go into a dark area. Replace any light bulbs as soon as they burn out. Set up your furniture so you have a clear path. Avoid moving your furniture around. If any of your floors are uneven, fix them. If there are any pets around you, be aware of where they are. Review your medicines with your doctor. Some medicines can make you feel dizzy. This can increase your chance of falling. Ask your doctor what other things that you can do to help prevent falls. This information is not intended to replace advice given to you by your health care provider. Make sure you discuss any questions you have with your health care provider. Document Released: 06/24/2009 Document Revised: 02/03/2016 Document Reviewed: 10/02/2014 Elsevier Interactive Patient Education  2017 [employer].

## 2021-04-20 ENCOUNTER — Ambulatory Visit (INDEPENDENT_AMBULATORY_CARE_PROVIDER_SITE_OTHER): Payer: Medicare Other

## 2021-04-20 ENCOUNTER — Other Ambulatory Visit: Payer: Self-pay

## 2021-04-20 ENCOUNTER — Ambulatory Visit (INDEPENDENT_AMBULATORY_CARE_PROVIDER_SITE_OTHER): Payer: Medicare Other | Admitting: Internal Medicine

## 2021-04-20 VITALS — BP 128/70 | HR 88 | Temp 96.8°F | Resp 16 | Ht 63.0 in | Wt 158.4 lb

## 2021-04-20 DIAGNOSIS — M546 Pain in thoracic spine: Secondary | ICD-10-CM

## 2021-04-20 DIAGNOSIS — I10 Essential (primary) hypertension: Secondary | ICD-10-CM

## 2021-04-20 DIAGNOSIS — R8290 Unspecified abnormal findings in urine: Secondary | ICD-10-CM

## 2021-04-20 DIAGNOSIS — M549 Dorsalgia, unspecified: Secondary | ICD-10-CM | POA: Insufficient documentation

## 2021-04-20 DIAGNOSIS — R0781 Pleurodynia: Secondary | ICD-10-CM | POA: Diagnosis not present

## 2021-04-20 DIAGNOSIS — E119 Type 2 diabetes mellitus without complications: Secondary | ICD-10-CM

## 2021-04-20 DIAGNOSIS — E7800 Pure hypercholesterolemia, unspecified: Secondary | ICD-10-CM

## 2021-04-20 DIAGNOSIS — D649 Anemia, unspecified: Secondary | ICD-10-CM

## 2021-04-20 DIAGNOSIS — Z0000 Encounter for general adult medical examination without abnormal findings: Secondary | ICD-10-CM

## 2021-04-20 DIAGNOSIS — I517 Cardiomegaly: Secondary | ICD-10-CM | POA: Diagnosis not present

## 2021-04-20 LAB — LIPID PANEL
Cholesterol: 369 mg/dL — ABNORMAL HIGH (ref 0–200)
HDL: 63.6 mg/dL (ref >39.00)
Total CHOL/HDL Ratio: 6
Triglycerides: 595 mg/dL — ABNORMAL HIGH (ref 0.0–149.0)

## 2021-04-20 LAB — BASIC METABOLIC PANEL
BUN: 24 mg/dL — ABNORMAL HIGH (ref 6–23)
CO2: 30 mEq/L (ref 19–32)
Calcium: 10.2 mg/dL (ref 8.4–10.5)
Chloride: 97 mEq/L (ref 96–112)
Creatinine, Ser: 1.03 mg/dL (ref 0.40–1.20)
GFR: 47.5 mL/min — ABNORMAL LOW (ref >60.00)
Glucose, Bld: 122 mg/dL — ABNORMAL HIGH (ref 70–99)
Potassium: 4.2 mEq/L (ref 3.5–5.1)
Sodium: 137 mEq/L (ref 135–145)

## 2021-04-20 LAB — LDL CHOLESTEROL, DIRECT: Direct LDL: 200 mg/dL

## 2021-04-20 LAB — URINALYSIS, ROUTINE W REFLEX MICROSCOPIC
Bilirubin Urine: NEGATIVE
Hgb urine dipstick: NEGATIVE
Ketones, ur: NEGATIVE
Leukocytes,Ua: NEGATIVE
Nitrite: NEGATIVE
RBC / HPF: NONE SEEN (ref 0–?)
Specific Gravity, Urine: 1.01 (ref 1.000–1.030)
Total Protein, Urine: NEGATIVE
Urine Glucose: NEGATIVE
Urobilinogen, UA: 0.2 (ref 0.0–1.0)
pH: 7 (ref 5.0–8.0)

## 2021-04-20 LAB — HEPATIC FUNCTION PANEL
ALT: 22 U/L (ref 0–35)
AST: 19 U/L (ref 0–37)
Albumin: 4.6 g/dL (ref 3.5–5.2)
Alkaline Phosphatase: 74 U/L (ref 39–117)
Bilirubin, Direct: 0.1 mg/dL (ref 0.0–0.3)
Total Bilirubin: 0.7 mg/dL (ref 0.2–1.2)
Total Protein: 7.4 g/dL (ref 6.0–8.3)

## 2021-04-20 LAB — HEMOGLOBIN A1C: Hgb A1c MFr Bld: 7 % — ABNORMAL HIGH (ref 4.6–6.5)

## 2021-04-20 NOTE — Progress Notes (Signed)
Patient ID: Tina Watson, female   DOB: 05-Nov-1927, 87 y.o.   MRN: [id number]   Subjective:    Patient ID: Tina Watson, female    DOB: December 16, 1927, 87 y.o.   MRN: [id number]  HPI This visit occurred during the SARS-CoV-2 public health emergency.  Safety protocols were in place, including screening questions prior to the visit, additional usage of staff PPE, and extensive cleaning of exam room while observing appropriate contact time as indicated for disinfecting solutions.   Patient with past history of hypertension and hypercholesterolemia.  She comes in today to follow up on these issues as well as for a complete physical exam.  She reports she is doing relatively well.  Has lesion - nose.  S/p graft previously.  This has occurred over previous incision site.  Plans to f/u with Dr Phillip Heal to evaluate.  Stays active.  No chest pain.  Breathing stable.  Does report some discomfort - right side back.  Notices some discomfort when twist and turns a certain way.  Noticed odor - urine.  No blood urine.  Off cholesterol medication.  Did not tolerate.    Past Medical History:  Diagnosis Date   Anemia    Cancer ([place])    Skin   Chicken pox    Colon polyps    Diabetes mellitus without complication ([place])    Diverticulosis    Granuloma annulare    Hypercholesterolemia    Hypertension    Osteoporosis    Past Surgical History:  Procedure Laterality Date   MOHS SURGERY  07/2014   BCCA on nose   TONSILLECTOMY  1952   vaginal hysterectomy with anterior repair  1990   Family History  Problem Relation Age of Onset   Stroke Father    Kidney failure Father    Cancer Mother        question of rectum/vaginal   Heart disease Brother        x4   Stroke Sister    Dementia Sister    Pneumonia Sister    Breast cancer Neg Hx    Colon cancer Neg Hx    Social History   Socioeconomic History   Marital status: Widowed    Spouse name: Not on file   Number of children: 2   Years of education: Not on file    Highest education level: Not on file  Occupational History   Not on file  Tobacco Use   Smoking status: Never   Smokeless tobacco: Never  Substance and Sexual Activity   Alcohol use: No    Alcohol/week: 0.0 standard drinks   Drug use: No   Sexual activity: Never  Other Topics Concern   Not on file  Social History Narrative   Not on file   Social Determinants of Health   Financial Resource Strain: Low Risk    Difficulty of Paying Living Expenses: Not hard at all  Food Insecurity: No Food Insecurity   Worried About Charity fundraiser in the Last Year: Never true   [place] in the Last Year: Never true  Transportation Needs: No Transportation Needs   Lack of Transportation (Medical): No   Lack of Transportation (Non-Medical): No  Physical Activity: Not on file  Stress: No Stress Concern Present   Feeling of Stress : Not at all  Social Connections: Unknown   Frequency of Communication with Friends and Family: More than three times a week   Frequency of Social Gatherings with Friends  and Family: Not on file   Attends Religious Services: Not on file   Active Member of Clubs or Organizations: Not on file   Attends Club or Organization Meetings: Not on file   Marital Status: Not on file    Review of Systems  Constitutional:  Negative for appetite change and unexpected weight change.  HENT:  Negative for congestion, sinus pressure and sore throat.   Eyes:  Negative for pain and visual disturbance.  Respiratory:  Negative for cough, chest tightness and shortness of breath.   Cardiovascular:  Negative for chest pain, palpitations and leg swelling.  Gastrointestinal:  Negative for abdominal pain, diarrhea, nausea and vomiting.  Genitourinary:  Negative for difficulty urinating and dysuria.  Musculoskeletal:  Negative for joint swelling and myalgias.  Skin:  Negative for color change and rash.  Neurological:  Negative for dizziness, light-headedness and headaches.   Hematological:  Negative for adenopathy. Does not bruise/bleed easily.  Psychiatric/Behavioral:  Negative for agitation and dysphoric mood.       Objective:    Physical Exam Vitals reviewed.  Constitutional:      General: She is not in acute distress.    Appearance: Normal appearance. She is well-developed.  HENT:     Head: Normocephalic and atraumatic.     Right Ear: External ear normal.     Left Ear: External ear normal.  Eyes:     General: No scleral icterus.       Right eye: No discharge.        Left eye: No discharge.     Conjunctiva/sclera: Conjunctivae normal.  Neck:     Thyroid: No thyromegaly.  Cardiovascular:     Rate and Rhythm: Normal rate and regular rhythm.  Pulmonary:     Effort: No tachypnea, accessory muscle usage or respiratory distress.     Breath sounds: Normal breath sounds. No decreased breath sounds or wheezing.  Chest:  Breasts:    Right: No inverted nipple, mass, nipple discharge or tenderness (no axillary adenopathy).     Left: No inverted nipple, mass, nipple discharge or tenderness (no axilarry adenopathy).  Abdominal:     General: Bowel sounds are normal.     Palpations: Abdomen is soft.     Tenderness: There is no abdominal tenderness.  Musculoskeletal:        General: No swelling or tenderness.     Cervical back: Neck supple.  Lymphadenopathy:     Cervical: No cervical adenopathy.  Skin:    Findings: No erythema or rash.  Neurological:     Mental Status: She is alert and oriented to person, place, and time.  Psychiatric:        Mood and Affect: Mood normal.        Behavior: Behavior normal.    BP 128/70   Pulse 88   Temp (!) 96.8 F (36 C)   Resp 16   Ht _0  (1.6 m)   Wt 158 lb 6.4 oz (71.8 kg)   LMP 09/13/1984   SpO2 98%   BMI 28.06 kg/m  Wt Readings from Last 3 Encounters:  04/20/21 158 lb 6.4 oz (71.8 kg)  02/23/21 158 lb (71.7 kg)  12/14/20 160 lb (72.6 kg)    Outpatient Encounter Medications as of 04/20/2021   Medication Sig   Calcium Carbonate-Vitamin D 600-400 MG-UNIT tablet Take 1 tablet by mouth daily.   cephALEXin (KEFLEX) 500 MG capsule Take 1 capsule (500 mg total) by mouth 2 (two) times daily.   ferrous  sulfate 325 (65 FE) MG tablet TAKE 1 TABLET BY MOUTH DAILY   fish oil-omega-3 fatty acids 1000 MG capsule Take 1 g by mouth daily.   glucose blood (BAYER CONTOUR NEXT TEST) test strip TEST ONCE DAILY   latanoprost (XALATAN) 0.005 % ophthalmic solution Place 1 drop into both eyes at bedtime.   Multiple Vitamin (MULTIVITAMIN) tablet Take 1 tablet by mouth daily.   triamterene-hydrochlorothiazide (MAXZIDE-25) 37.5-25 MG tablet TAKE 1/2 TABLET BY MOUTH EVERY DAY   [DISCONTINUED] telmisartan (MICARDIS) 40 MG tablet TAKE 1 TABLET BY MOUTH EVERY DAY   [DISCONTINUED] ammonium lactate (LAC-HYDRIN) 12 % lotion Apply 1 application topically as needed for dry skin. Apply to arms as needed (Patient not taking: Reported on 04/20/2021)   [DISCONTINUED] lovastatin (MEVACOR) 10 MG tablet TAKE 1 TABLET BY MOUTH THREE DAYS PER WEEK. (Patient not taking: Reported on 04/20/2021)   No facility-administered encounter medications on file as of 04/20/2021.     Lab Results  Component Value Date   WBC 8.4 08/18/2020   HGB 13.6 08/18/2020   HCT 40.6 08/18/2020   PLT 260.0 08/18/2020   GLUCOSE 122 (H) 04/20/2021   CHOL 369 (H) 04/20/2021   TRIG (H) 04/20/2021    595.0 Triglyceride is over 400; calculations on Lipids are invalid.   HDL 63.60 04/20/2021   LDLDIRECT 200.0 04/20/2021   ALT 22 04/20/2021   AST 19 04/20/2021   NA 137 04/20/2021   K 4.2 04/20/2021   CL 97 04/20/2021   CREATININE 1.03 04/20/2021   BUN 24 (H) 04/20/2021   CO2 30 04/20/2021   TSH 2.98 08/18/2020   HGBA1C 7.0 (H) 04/20/2021   MICROALBUR 1.1 08/18/2020       Assessment & Plan:   Problem List Items Addressed This Visit     Anemia    Follow cbc.       Back pain    Back pain as outlined.  Certain movements aggravate.  Sore to  palpation.  Check thoracic spine xray and posterior rib xray.  Further w/up pending results.        Relevant Orders   DG Thoracic Spine 2 View (Completed)   DG Ribs Unilateral Right (Completed)   Bad odor of urine    Check urine to confirm no infection.       Relevant Orders   Urinalysis, Routine w reflex microscopic (Completed)   Urine Culture (Completed)   Diabetes (HCC)    Low carb diet and exercise given elevated sugars.  Follow met b and a1c.   Lab Results  Component Value Date   HGBA1C 7.0 (H) 04/20/2021       Relevant Orders   Hemoglobin A1c (Completed)   Health care maintenance    Physical today 04/20/21.  Declines mammogram. Colonoscopy 12/2013.       Hypercholesterolemia    Intolerance to multiple statins. Off all cholesterol medication now. Desires not to try another medication.  Follow cholesterol.  Low cholesterol diet and exercise.  Follow lipid panel.       Relevant Orders   Hepatic function panel (Completed)   Lipid panel (Completed)   Hypertension - Primary    Blood pressure as outlined.  Continue triam/hctz and micardis.  Follow pressures. Follow metabolic panel.        Relevant Orders   Basic metabolic panel (Completed)     Einar Pheasant, MD

## 2021-04-21 DIAGNOSIS — D485 Neoplasm of uncertain behavior of skin: Secondary | ICD-10-CM | POA: Diagnosis not present

## 2021-04-21 DIAGNOSIS — C44311 Basal cell carcinoma of skin of nose: Secondary | ICD-10-CM | POA: Diagnosis not present

## 2021-04-21 DIAGNOSIS — L570 Actinic keratosis: Secondary | ICD-10-CM | POA: Diagnosis not present

## 2021-04-22 LAB — URINE CULTURE
MICRO NUMBER:: 12225409
SPECIMEN QUALITY:: ADEQUATE

## 2021-04-24 ENCOUNTER — Other Ambulatory Visit: Payer: Self-pay | Admitting: Internal Medicine

## 2021-04-25 ENCOUNTER — Encounter: Payer: Self-pay | Admitting: Internal Medicine

## 2021-04-25 MED ORDER — CEPHALEXIN 500 MG PO CAPS: 500.0000 mg | ORAL_CAPSULE | Freq: Two times a day (BID) | ORAL | 0 refills | Status: DC

## 2021-04-25 NOTE — Assessment & Plan Note (Signed)
Low carb diet and exercise given elevated sugars.  Follow met b and a1c.   Lab Results  Component Value Date   HGBA1C 7.0 (H) 04/20/2021

## 2021-04-25 NOTE — Assessment & Plan Note (Signed)
Intolerance to multiple statins. Off all cholesterol medication now. Desires not to try another medication.  Follow cholesterol.  Low cholesterol diet and exercise.  Follow lipid panel.   

## 2021-04-25 NOTE — Assessment & Plan Note (Signed)
Back pain as outlined.  Certain movements aggravate.  Sore to palpation.  Check thoracic spine xray and posterior rib xray.  Further w/up pending results.

## 2021-04-25 NOTE — Assessment & Plan Note (Signed)
Blood pressure as outlined.  Continue triam/hctz and micardis.  Follow pressures. Follow metabolic panel.

## 2021-04-25 NOTE — Assessment & Plan Note (Signed)
Check urine to confirm no infection.  

## 2021-04-25 NOTE — Assessment & Plan Note (Addendum)
Physical today 04/20/21.  Declines mammogram. Colonoscopy 12/2013.

## 2021-04-25 NOTE — Assessment & Plan Note (Signed)
Follow cbc.  

## 2021-05-10 DIAGNOSIS — C44311 Basal cell carcinoma of skin of nose: Secondary | ICD-10-CM | POA: Insufficient documentation

## 2021-05-17 ENCOUNTER — Encounter: Payer: Self-pay | Admitting: Internal Medicine

## 2021-05-19 NOTE — Telephone Encounter (Signed)
yes

## 2021-05-19 NOTE — Telephone Encounter (Signed)
It appears she is having reconstructive surgery with light anesthesia on 9/29. Do we need to schedule pre-op? I have not received any clearance forms.

## 2021-05-26 DIAGNOSIS — H903 Sensorineural hearing loss, bilateral: Secondary | ICD-10-CM | POA: Diagnosis not present

## 2021-06-08 DIAGNOSIS — Z85828 Personal history of other malignant neoplasm of skin: Secondary | ICD-10-CM | POA: Diagnosis not present

## 2021-06-08 DIAGNOSIS — L814 Other melanin hyperpigmentation: Secondary | ICD-10-CM | POA: Diagnosis not present

## 2021-06-08 DIAGNOSIS — C44311 Basal cell carcinoma of skin of nose: Secondary | ICD-10-CM | POA: Diagnosis not present

## 2021-06-08 DIAGNOSIS — L570 Actinic keratosis: Secondary | ICD-10-CM | POA: Diagnosis not present

## 2021-06-08 DIAGNOSIS — L578 Other skin changes due to chronic exposure to nonionizing radiation: Secondary | ICD-10-CM | POA: Diagnosis not present

## 2021-06-09 DIAGNOSIS — Z86010 Personal history of colonic polyps: Secondary | ICD-10-CM | POA: Diagnosis not present

## 2021-06-09 DIAGNOSIS — I10 Essential (primary) hypertension: Secondary | ICD-10-CM | POA: Diagnosis not present

## 2021-06-09 DIAGNOSIS — C44311 Basal cell carcinoma of skin of nose: Secondary | ICD-10-CM | POA: Diagnosis not present

## 2021-06-09 DIAGNOSIS — E119 Type 2 diabetes mellitus without complications: Secondary | ICD-10-CM | POA: Diagnosis not present

## 2021-07-18 DIAGNOSIS — L578 Other skin changes due to chronic exposure to nonionizing radiation: Secondary | ICD-10-CM | POA: Diagnosis not present

## 2021-07-18 DIAGNOSIS — Z872 Personal history of diseases of the skin and subcutaneous tissue: Secondary | ICD-10-CM | POA: Diagnosis not present

## 2021-07-18 DIAGNOSIS — D485 Neoplasm of uncertain behavior of skin: Secondary | ICD-10-CM | POA: Diagnosis not present

## 2021-07-18 DIAGNOSIS — D0439 Carcinoma in situ of skin of other parts of face: Secondary | ICD-10-CM | POA: Diagnosis not present

## 2021-07-18 DIAGNOSIS — Z85828 Personal history of other malignant neoplasm of skin: Secondary | ICD-10-CM | POA: Diagnosis not present

## 2021-08-18 DIAGNOSIS — H401131 Primary open-angle glaucoma, bilateral, mild stage: Secondary | ICD-10-CM | POA: Diagnosis not present

## 2021-08-22 ENCOUNTER — Other Ambulatory Visit: Payer: Self-pay

## 2021-08-22 ENCOUNTER — Telehealth: Payer: Self-pay | Admitting: Internal Medicine

## 2021-08-22 MED ORDER — TRIAMTERENE-HCTZ 37.5-25 MG PO TABS: 0.5000 | ORAL_TABLET | Freq: Every day | ORAL | 1 refills | Status: DC

## 2021-08-22 NOTE — Telephone Encounter (Signed)
Pt is needing refill for triamterene-hydrochlorothiazide (MAXZIDE-25) 37.5-25 MG tablet Pt prescription was previously sent to [facility] however this establishment has since closed. Pt would like medications sent to [facility] in [place].

## 2021-08-22 NOTE — Telephone Encounter (Signed)
Medication refilled

## 2021-08-25 ENCOUNTER — Telehealth: Payer: Self-pay | Admitting: Internal Medicine

## 2021-08-25 ENCOUNTER — Other Ambulatory Visit: Payer: Self-pay

## 2021-08-25 ENCOUNTER — Ambulatory Visit (INDEPENDENT_AMBULATORY_CARE_PROVIDER_SITE_OTHER): Payer: Medicare Other | Admitting: Internal Medicine

## 2021-08-25 DIAGNOSIS — I10 Essential (primary) hypertension: Secondary | ICD-10-CM

## 2021-08-25 DIAGNOSIS — U071 COVID-19: Secondary | ICD-10-CM | POA: Diagnosis not present

## 2021-08-25 DIAGNOSIS — E1165 Type 2 diabetes mellitus with hyperglycemia: Secondary | ICD-10-CM

## 2021-08-25 MED ORDER — MOLNUPIRAVIR EUA 200MG CAPSULE: 4.0000 | ORAL_CAPSULE | Freq: Two times a day (BID) | ORAL | 0 refills | Status: AC

## 2021-08-25 NOTE — Telephone Encounter (Signed)
Daughters COVID test was positive. Pt stated that they do not need anything further and will call back if their symptoms do not continue to improve.

## 2021-08-25 NOTE — Progress Notes (Signed)
Patient ID: Tina Watson, female   DOB: 06/27/1928, 87 y.o.   MRN: [id number]   Virtual Visit via telephone Note  This visit type was conducted due to national recommendations for restrictions regarding the COVID-19 pandemic (e.g. social distancing).  This format is felt to be most appropriate for this patient at this time.  All issues noted in this document were discussed and addressed.  No physical exam was performed (except for noted visual exam findings with Video Visits).   I connected with Casper Harrison by telephone and verified that I am speaking with the correct person using two identifiers. Location patient: home Location provider: work Persons participating in the telephone visit: patient, provider  The limitations, risks, security and privacy concerns of performing an evaluation and management service by telephone and the availability of in person appointments have been discussed.  It has also been discussed with the patient that there may be a patient responsible charge related to this service. The patient expressed understanding and agreed to proceed.   Reason for visit: work in appt  HPI: Work in:  covid positive.  States symptoms started 08/23/21.  Noticed dry cough.  That evening - "fluid from nose".  Sore throat.  Few chills.  Temperature 100.8.  Tested positive for covid 08/24/21.  Temperature this am 100.8.  sore throat is persistent.  Has been gargling with listerene.  She is eating.  Does report decreased appetite.  No loss of taste or smell.  No chest congestion or cough.  No chest pain or sob.  No increased sinus pressure.  Slight headache.  Nasal congestion.  Some aching.     ROS: See pertinent positives and negatives per HPI.  Past Medical History:  Diagnosis Date   Anemia    Cancer ([place])    Skin   Chicken pox    Colon polyps    Diabetes mellitus without complication ([place])    Diverticulosis    Granuloma annulare    Hypercholesterolemia    Hypertension     Osteoporosis     Past Surgical History:  Procedure Laterality Date   MOHS SURGERY  07/2014   BCCA on nose   TONSILLECTOMY  1952   vaginal hysterectomy with anterior repair  1990    Family History  Problem Relation Age of Onset   Stroke Father    Kidney failure Father    Cancer Mother        question of rectum/vaginal   Heart disease Brother        x4   Stroke Sister    Dementia Sister    Pneumonia Sister    Breast cancer Neg Hx    Colon cancer Neg Hx     SOCIAL HX: reviewed.    Current Outpatient Medications:    molnupiravir EUA (LAGEVRIO) 200 mg CAPS capsule, Take 4 capsules (800 mg total) by mouth 2 (two) times daily for 5 days., Disp: 40 capsule, Rfl: 0   Calcium Carbonate-Vitamin D 600-400 MG-UNIT tablet, Take 1 tablet by mouth daily., Disp: , Rfl:    ferrous sulfate 325 (65 FE) MG tablet, TAKE 1 TABLET BY MOUTH DAILY, Disp: 30 tablet, Rfl: 5   fish oil-omega-3 fatty acids 1000 MG capsule, Take 1 g by mouth daily., Disp: , Rfl:    glucose blood (BAYER CONTOUR NEXT TEST) test strip, TEST ONCE DAILY, Disp: 100 each, Rfl: 2   latanoprost (XALATAN) 0.005 % ophthalmic solution, Place 1 drop into both eyes at bedtime., Disp: , Rfl:  Multiple Vitamin (MULTIVITAMIN) tablet, Take 1 tablet by mouth daily., Disp: , Rfl:    telmisartan (MICARDIS) 40 MG tablet, TAKE 1 TABLET BY MOUTH EVERY DAY, Disp: 90 tablet, Rfl: 1   triamterene-hydrochlorothiazide (MAXZIDE-25) 37.5-25 MG tablet, Take 0.5 tablets by mouth daily., Disp: 45 tablet, Rfl: 1  EXAM:  VITALS per patient if applicable: temperature 100.8.    GENERAL: alert.  Answering questions appropriately. Sounds to be in no acute distress.   PSYCH/NEURO: pleasant and cooperative, no obvious depression or anxiety, speech and thought processing grossly intact  ASSESSMENT AND PLAN:  Discussed the following assessment and plan:  Problem List Items Addressed This Visit     COVID-19 virus infection    Symptoms as outlined.  No  chest pain, chest congestion or sob.  Tested positive 08/24/21.  Symptoms started 08/23/21.  Discussed otc treatment.  Discussed robitussin DM to help with congestion and cough.  Discussed tylenol for fever.  She desires to hold on taking any otc medication at this time.  Discussed oral antiviral medication and EUA.  She was agreeable to start.  rx for molnupiravir.  Rest.  Stay hydrated.  Call with update.  Discussed quarantine guidelines.        Relevant Medications   molnupiravir EUA (LAGEVRIO) 200 mg CAPS capsule   Diabetes ([place])    Follow sugars.        Hypertension    Continue triam/hctz and micardis.  Follow pressures.         Return if symptoms worsen or fail to improve.   I discussed the assessment and treatment plan with the patient. The patient was provided an opportunity to ask questions and all were answered. The patient agreed with the plan and demonstrated an understanding of the instructions.   The patient was advised to call back or seek an in-person evaluation if the symptoms worsen or if the condition fails to improve as anticipated.  I provided 23 minutes of non-face-to-face time during this encounter.   Einar Pheasant, MD

## 2021-08-25 NOTE — Telephone Encounter (Signed)
Discussed with [place].  Daughter tested positive.  Does not feel needs visit.  Declines need for further mediation or intervention.  Will call back if needs anything.

## 2021-08-25 NOTE — Telephone Encounter (Signed)
Pt was seen VV 12/15 at 10AM. Pts daughter was advised to take a home covid test. Test was pos.

## 2021-08-26 ENCOUNTER — Encounter: Payer: Self-pay | Admitting: Internal Medicine

## 2021-08-26 DIAGNOSIS — U071 COVID-19: Secondary | ICD-10-CM | POA: Insufficient documentation

## 2021-08-26 NOTE — Assessment & Plan Note (Signed)
Symptoms as outlined.  No chest pain, chest congestion or sob.  Tested positive 08/24/21.  Symptoms started 08/23/21.  Discussed otc treatment.  Discussed robitussin DM to help with congestion and cough.  Discussed tylenol for fever.  She desires to hold on taking any otc medication at this time.  Discussed oral antiviral medication and EUA.  She was agreeable to start.  rx for molnupiravir.  Rest.  Stay hydrated.  Call with update.  Discussed quarantine guidelines.

## 2021-08-26 NOTE — Assessment & Plan Note (Signed)
Continue triam/hctz and micardis.  Follow pressures.

## 2021-08-26 NOTE — Assessment & Plan Note (Signed)
Follow sugars.

## 2021-09-14 IMAGING — DX DG THORACIC SPINE 2V
3 series · 3 of 3 positions shown · non-contrast
Comparison: None.

CLINICAL DATA: Persistent back pain, posterior rib pain

EXAM:
RIGHT RIBS - 2 VIEW; THORACIC SPINE 2 VIEWS

[thoracic spine ap]
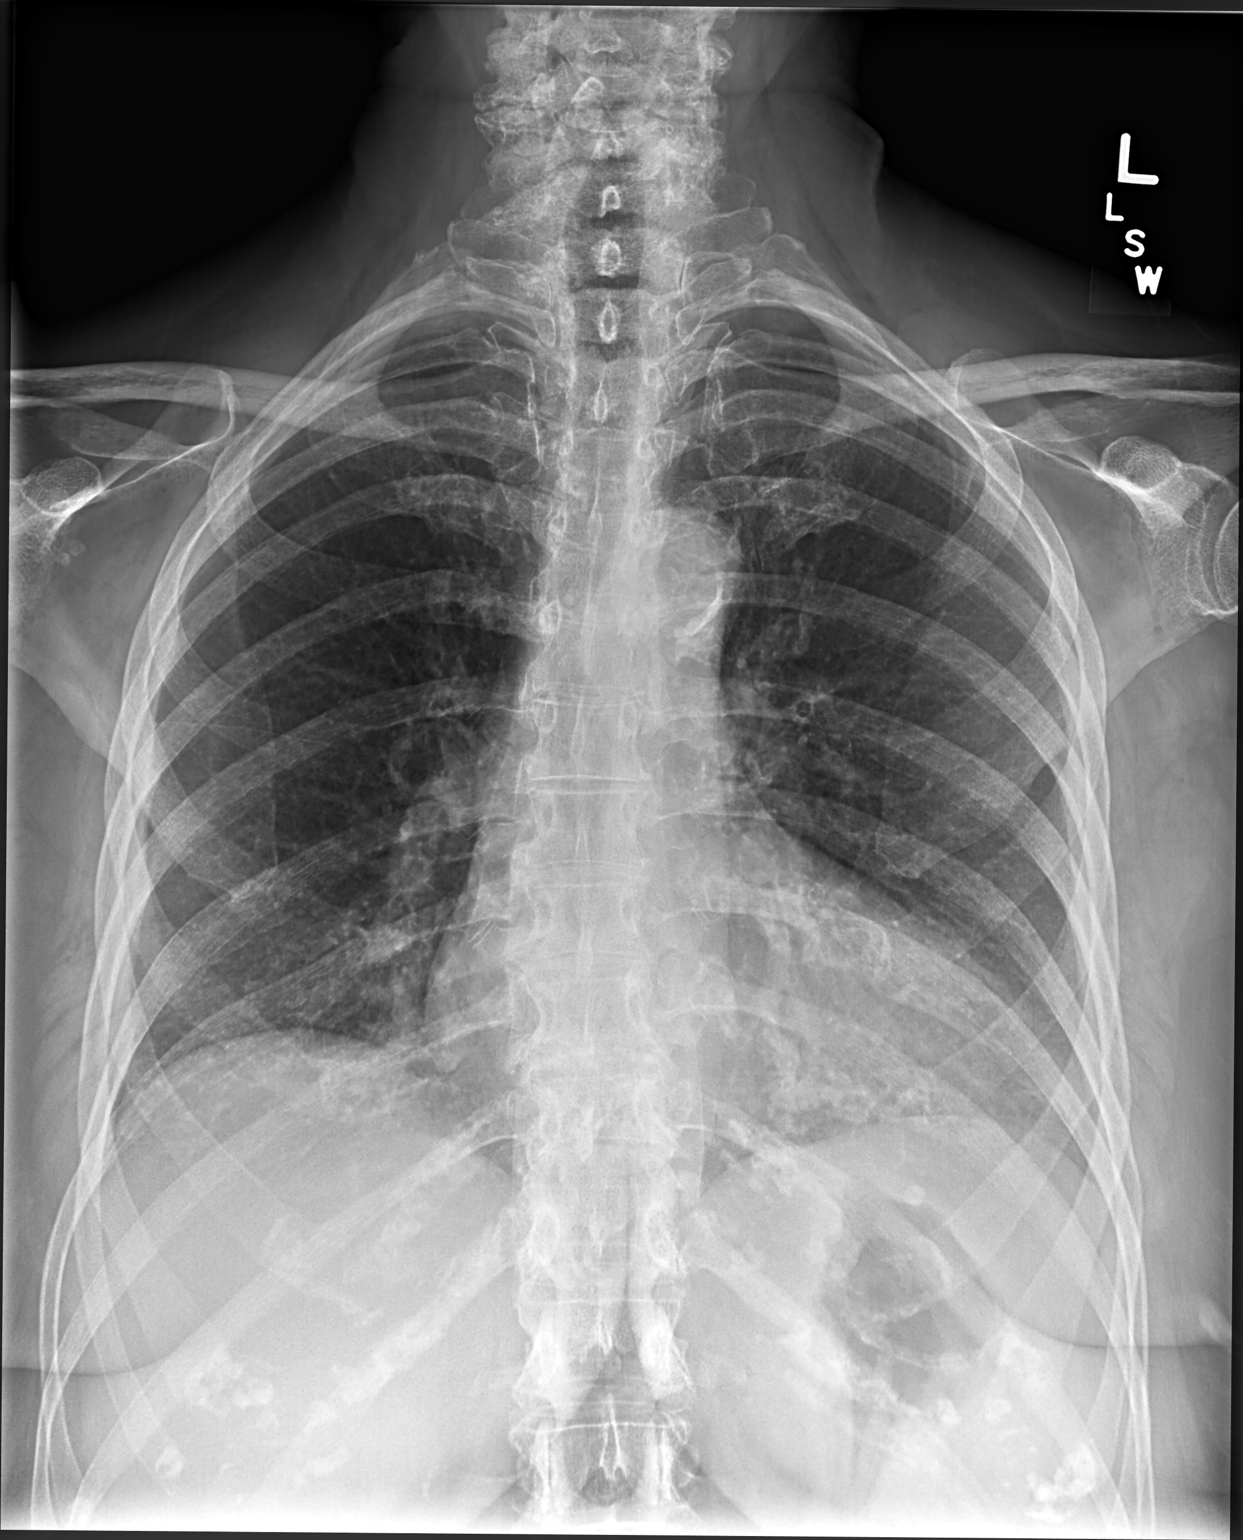

[thoracic spine lat]
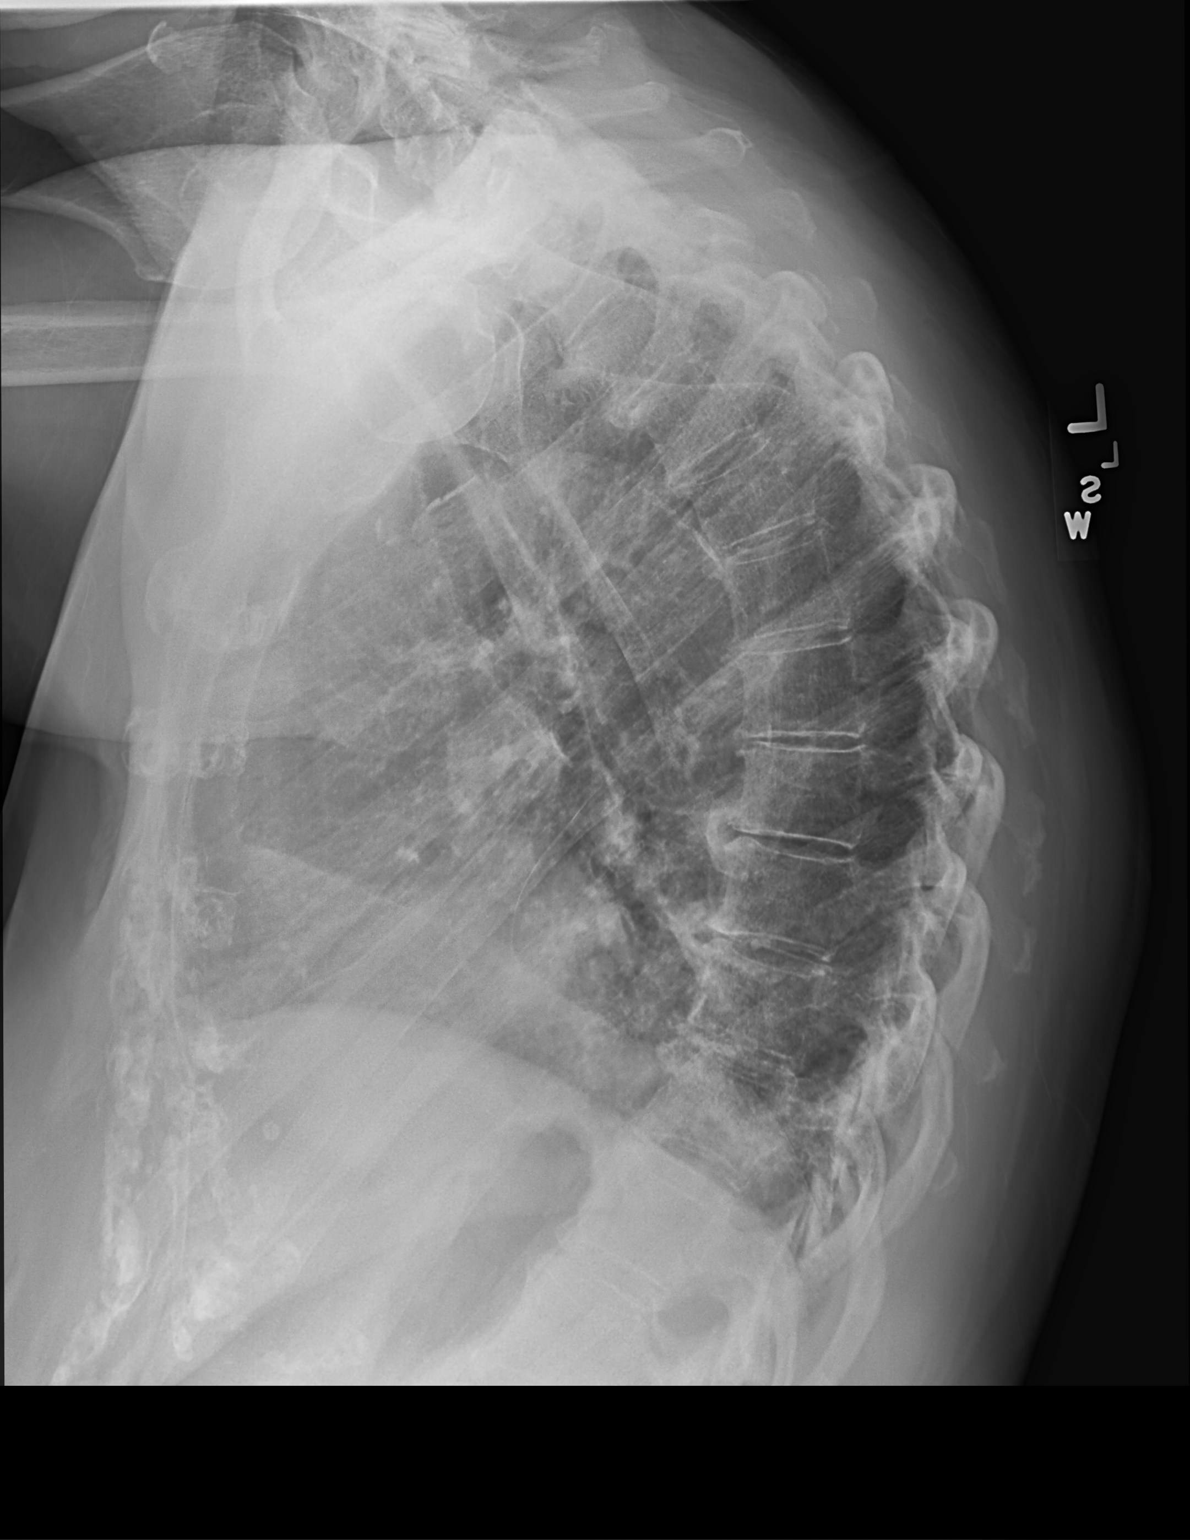

[swimmers lat]
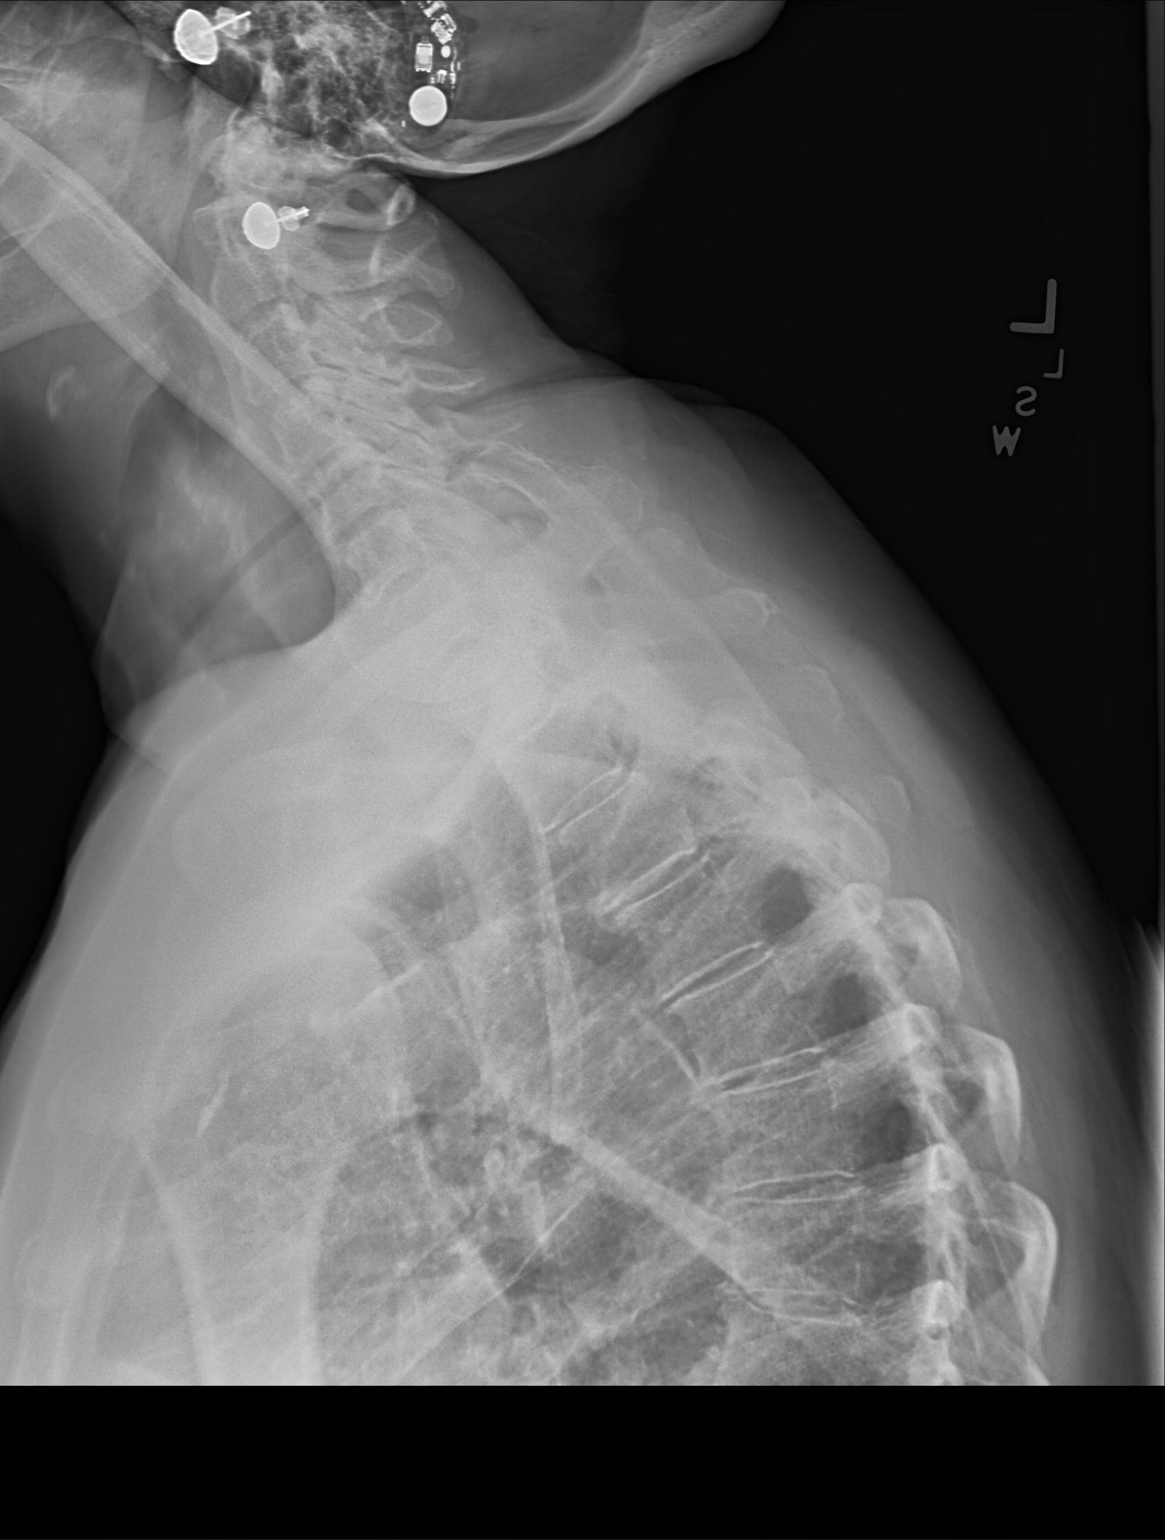

[3 of 3 positions shown; findings below may reference images not displayed]

FINDINGS: No displaced fracture or other bone lesions are seen involving the
right ribs.

No fracture or dislocation of the thoracic spine. Moderate
multilevel disc space height loss and osteophytosis throughout.

Mild cardiomegaly.
IMPRESSION: 1. No displaced fracture or other radiographic abnormality of the
right ribs.
2. No fracture or dislocation of the thoracic spine. Moderate
multilevel disc space height loss and osteophytosis throughout.
3. Mild cardiomegaly.

## 2021-09-23 DIAGNOSIS — L578 Other skin changes due to chronic exposure to nonionizing radiation: Secondary | ICD-10-CM | POA: Diagnosis not present

## 2021-09-23 DIAGNOSIS — Z85828 Personal history of other malignant neoplasm of skin: Secondary | ICD-10-CM | POA: Diagnosis not present

## 2021-10-26 ENCOUNTER — Other Ambulatory Visit: Payer: Self-pay | Admitting: Internal Medicine

## 2021-11-19 ENCOUNTER — Other Ambulatory Visit: Payer: Self-pay | Admitting: Internal Medicine

## 2022-01-18 DIAGNOSIS — Z859 Personal history of malignant neoplasm, unspecified: Secondary | ICD-10-CM | POA: Diagnosis not present

## 2022-01-18 DIAGNOSIS — Z85828 Personal history of other malignant neoplasm of skin: Secondary | ICD-10-CM | POA: Diagnosis not present

## 2022-01-18 DIAGNOSIS — L578 Other skin changes due to chronic exposure to nonionizing radiation: Secondary | ICD-10-CM | POA: Diagnosis not present

## 2022-01-21 ENCOUNTER — Other Ambulatory Visit: Payer: Self-pay | Admitting: Internal Medicine

## 2022-02-16 DIAGNOSIS — H401131 Primary open-angle glaucoma, bilateral, mild stage: Secondary | ICD-10-CM | POA: Diagnosis not present

## 2022-02-16 DIAGNOSIS — E119 Type 2 diabetes mellitus without complications: Secondary | ICD-10-CM | POA: Diagnosis not present

## 2022-02-16 DIAGNOSIS — H2513 Age-related nuclear cataract, bilateral: Secondary | ICD-10-CM | POA: Diagnosis not present

## 2022-02-16 LAB — HM DIABETES EYE EXAM

## 2022-02-24 ENCOUNTER — Ambulatory Visit (INDEPENDENT_AMBULATORY_CARE_PROVIDER_SITE_OTHER): Payer: Medicare Other

## 2022-02-24 VITALS — BP 141/75 | HR 88 | Ht 63.0 in | Wt 154.0 lb

## 2022-02-24 DIAGNOSIS — Z0000 Encounter for general adult medical examination without abnormal findings: Secondary | ICD-10-CM

## 2022-02-24 NOTE — Patient Instructions (Addendum)
  Ms. Coghill , Thank you for taking time to come for your Medicare Wellness Visit. I appreciate your ongoing commitment to your health goals. Please review the following plan we discussed and let me know if I can assist you in the future.   These are the goals we discussed:  Goals       Patient Stated     I am trying to reduce my sugar intake (pt-stated)        This is a list of the screening recommended for you and due dates:  Health Maintenance  Topic Date Due   Complete foot exam   12/14/2021   Hemoglobin A1C  03/10/2022*   COVID-19 Vaccine (4 - Booster for Pfizer series) 03/12/2022*   DEXA scan (bone density measurement)  05/12/2022*   Zoster (Shingles) Vaccine (1 of 2) 05/27/2022*   Tetanus Vaccine  02/25/2023*   Mammogram  12/25/2036*   Flu Shot  04/11/2022   Eye exam for diabetics  02/23/2023   Pneumonia Vaccine  Completed   HPV Vaccine  Aged Out  *Topic was postponed. The date shown is not the original due date.

## 2022-02-24 NOTE — Progress Notes (Signed)
Subjective:   Tina Watson is a 88 y.o. female who presents for Medicare Annual (Subsequent) preventive examination.  Review of Systems    No ROS.  Medicare Wellness Virtual Visit.  Visual/audio telehealth visit, UTA vital signs.   See social history for additional risk factors.   Cardiac Risk Factors include: advanced age (>48mn, >>68women);diabetes mellitus;hypertension     Objective:    Today's Vitals   02/24/22 1005  BP: (!) 141/75  Pulse: 88  Weight: 154 lb (69.9 kg)  Height: '5\' 3"'$  (1.6 m)   Body mass index is 27.28 kg/m.     02/24/2022   10:26 AM 02/23/2021   11:40 AM 02/23/2020   11:12 AM 02/20/2019   11:48 AM 12/03/2017    2:01 PM 11/30/2016    1:24 PM 11/30/2015    3:44 PM  Advanced Directives  Does Patient Have a Medical Advance Directive? Yes No Yes Yes Yes Yes Yes  Type of AParamed[place]of [place]ine Valley[place] will  [place] will Living will;Healthcare Power of [place] will [place] will  Does patient want to make changes to medical advance directive? No - Patient declined  No - Patient declined No - Patient declined No - Patient declined No - Patient declined   Copy of [place]in Chart? No - copy requested  No - copy requested No - copy requested No - copy requested No - copy requested No - copy requested  Would patient like information on creating a medical advance directive?  No - Patient declined         Current Medications (verified) Outpatient Encounter Medications as of 02/24/2022  Medication Sig   Calcium Carbonate-Vitamin D 600-400 MG-UNIT tablet Take 1 tablet by mouth daily.   ferrous sulfate 325 (65 FE) MG tablet TAKE 1 TABLET BY MOUTH DAILY   fish oil-omega-3 fatty acids 1000 MG capsule Take 1 g by mouth daily.   glucose blood (BAYER CONTOUR NEXT TEST) test strip TEST ONCE DAILY   latanoprost  (XALATAN) 0.005 % ophthalmic solution Place 1 drop into both eyes at bedtime.   Multiple Vitamin (MULTIVITAMIN) tablet Take 1 tablet by mouth daily.   telmisartan (MICARDIS) 40 MG tablet TAKE 1 TABLET BY MOUTH EVERY DAY   triamterene-hydrochlorothiazide (MAXZIDE-25) 37.5-25 MG tablet TAKE 1/2 TABLET BY MOUTH EVERY DAY   No facility-administered encounter medications on file as of 02/24/2022.    Allergies (verified) Demerol [meperidine], Fluvastatin, and Lescol [fluvastatin sodium]   History: Past Medical History:  Diagnosis Date   Anemia    Cancer ([place]    Skin   Chicken pox    Colon polyps    Diabetes mellitus without complication ([place]    Diverticulosis    Granuloma annulare    Hypercholesterolemia    Hypertension    Osteoporosis    Past Surgical History:  Procedure Laterality Date   MOHS SURGERY  07/2014   BCCA on nose   TONSILLECTOMY  1952   vaginal hysterectomy with anterior repair  1990   Family History  Problem Relation Age of Onset   Stroke Father    Kidney failure Father    Cancer Mother        question of rectum/vaginal   Heart disease Brother        x4   Stroke Sister    Dementia Sister    Pneumonia Sister    Breast cancer Neg  Hx    Colon cancer Neg Hx    Social History   Socioeconomic History   Marital status: Widowed    Spouse name: Not on file   Number of children: 2   Years of education: Not on file   Highest education level: Not on file  Occupational History   Not on file  Tobacco Use   Smoking status: Never   Smokeless tobacco: Never  Substance and Sexual Activity   Alcohol use: No    Alcohol/week: 0.0 standard drinks of alcohol   Drug use: No   Sexual activity: Never  Other Topics Concern   Not on file  Social History Narrative   Not on file   Social Determinants of Health   Financial Resource Strain: Low Risk  (02/23/2021)   Overall Financial Resource Strain (CARDIA)    Difficulty of Paying Living Expenses: Not hard at all   Food Insecurity: No Food Insecurity (02/23/2021)   Hunger Vital Sign    Worried About Running Out of Food in the Last Year: Never true    Ran Out of Food in the Last Year: Never true  Transportation Needs: No Transportation Needs (02/23/2021)   PRAPARE - Hydrologist (Medical): No    Lack of Transportation (Non-Medical): No  Physical Activity: Unknown (02/20/2019)   Exercise Vital Sign    Days of Exercise per Week: 0 days    Minutes of Exercise per Session: Not on file  Stress: No Stress Concern Present (02/23/2021)   [place]    Feeling of Stress : Not at all  Social Connections: Unknown (02/23/2021)   Social Connection and Isolation Panel [NHANES]    Frequency of Communication with Friends and Family: More than three times a week    Frequency of Social Gatherings with Friends and Family: Not on file    Attends Religious Services: Not on file    Active Member of Clubs or Organizations: Not on file    Attends Archivist Meetings: Not on file    Marital Status: Not on file    Tobacco Counseling Counseling given: Not Answered   Clinical Intake:  Pre-visit preparation completed: Yes        Diabetes: Yes (Followed by PCP)             Activities of Daily Living    02/24/2022   10:08 AM  In your present state of health, do you have any difficulty performing the following activities:  Hearing? 1  Comment Hearing aids  Vision? 0  Difficulty concentrating or making decisions? 0  Comment Age appropriate  Walking or climbing stairs? 0  Dressing or bathing? 0  Doing errands, shopping? 0  Preparing Food and eating ? N  Comment Daughter prepares meals once a week.  Using the Toilet? N  In the past six months, have you accidently leaked urine? Y  Comment Managed with daily pad  Do you have problems with loss of bowel control? N  Managing your Medications? N   Managing your Finances? N  Housekeeping or managing your Housekeeping? N    Patient Care Team: Einar Pheasant, MD as PCP - General (Internal Medicine)  Indicate any recent Medical Services you may have received from other than [facility] providers in the past year (date may be approximate).     Assessment:   This is a routine wellness examination for [place].  Virtual Visit via Telephone Note  I  connected with  Towanda Octave on 02/24/22 at 10:00 AM EDT by telephone and verified that I am speaking with the correct person using two identifiers.  Persons participating in the virtual visit: patient/Nurse Health Advisor   I discussed the limitations of performing an evaluation and management service by telehealth. We continued and completed visit with audio only. Some vital signs may be absent or patient reported.   Hearing/Vision screen Hearing Screening - Comments:: Hearing aids, bilateral Vision Screening - Comments:: Followed [facility] Wears corrective lenses Annual visits  Glaucoma suspect; drops in use They have regular follow up with the ophthalmologist  Dietary issues and exercise activities discussed: Current Exercise Habits: Home exercise routine, Type of exercise: stretching;walking, Intensity: Mild Regular diet; trying to monitor carb intake FBS 136 Good water intake   Goals Addressed               This Visit's Progress     Patient Stated     I am trying to reduce my sugar intake (pt-stated)         Depression Screen    02/24/2022   10:18 AM 02/23/2021   11:32 AM 02/23/2020   11:13 AM 02/20/2019   11:50 AM 12/03/2017    1:23 PM 11/30/2016    1:36 PM 11/30/2015    3:47 PM  PHQ 2/9 Scores  PHQ - 2 Score 0 0 0 0 0 0 0    Fall Risk    02/24/2022   10:18 AM 02/23/2021   11:37 AM 02/23/2020   11:13 AM 06/10/2019   11:12 AM 02/20/2019   11:50 AM  Fall Risk   Falls in the past year? 0 0 0 0 0  Number falls in past yr: 0 0 0    Injury with Fall?  0      Risk for fall due to :    Impaired vision   Follow up Falls evaluation completed Falls evaluation completed Falls evaluation completed Falls evaluation completed     FALL RISK PREVENTION PERTAINING TO THE HOME: Home free of loose throw rugs in walkways, pet beds, electrical cords, etc? Yes  Adequate lighting in your home to reduce risk of falls? Yes   ASSISTIVE DEVICES UTILIZED TO PREVENT FALLS: Life alert? No  Use of a cane, walker or w/c? No  Grab bars in the bathroom? Yes  Shower chair or bench in shower? No  Elevated toilet seat or a handicapped toilet? No   TIMED UP AND GO: Was the test performed? Yes .   Cognitive Function: Patient is alert and oriented x3.  Enjoys reading for brain health stimulation.      11/30/2015    3:55 PM  MMSE - Mini Mental State Exam  Orientation to time 5  Orientation to Place 5  Registration 3  Attention/ Calculation 5  Recall 3  Language- name 2 objects 2  Language- repeat 1  Language- follow 3 step command 3  Language- read & follow direction 1  Write a sentence 1  Copy design 1  Total score 30        02/23/2021    1:16 PM 02/23/2020   11:24 AM 02/20/2019   11:52 AM 12/03/2017    2:04 PM 11/30/2016    1:50 PM  6CIT Screen  What Year? 0 points 0 points 0 points 0 points 0 points  What month? 0 points 0 points 0 points 0 points 0 points  What time? 0 points  0 points  0 points 0 points  Count back from 20 0 points  0 points 0 points 0 points  Months in reverse 0 points 0 points 0 points 0 points 0 points  Repeat phrase     0 points  Total Score     0 points    Immunizations Immunization History  Administered Date(s) Administered   Fluad Quad(high Dose 65+) 06/10/2019, 07/14/2020   Influenza Split 06/13/2012, 07/04/2012   Influenza, High Dose Seasonal PF 08/21/2016, 07/03/2017, 07/19/2018   Influenza,inj,Quad PF,6+ Mos 06/25/2013, 06/08/2014, 06/02/2015   Influenza-Unspecified 07/04/2012, 06/25/2013, 06/08/2014, 08/21/2016,  07/03/2017, 07/03/2017, 07/19/2018   PFIZER(Purple Top)SARS-COV-2 Vaccination 09/13/2019, 10/24/2019, 06/23/2020   Pneumococcal Conjugate-13 12/25/2016   Pneumococcal Polysaccharide-23 01/01/2018   TDAP status: Due, Education has been provided regarding the importance of this vaccine. Advised may receive this vaccine at local pharmacy or Health Dept. Aware to provide a copy of the vaccination record if obtained from local pharmacy or Health Dept. Verbalized acceptance and understanding.  Shingrix vaccine- Due, Education has been provided regarding the importance of this vaccine. Advised may receive this vaccine at local pharmacy or Health Dept. Aware to provide a copy of the vaccination record if obtained from local pharmacy or Health Dept. Verbalized acceptance and understanding.  Screening Tests Health Maintenance  Topic Date Due   FOOT EXAM  12/14/2021   HEMOGLOBIN A1C  03/10/2022 (Originally 10/21/2021)   COVID-19 Vaccine (4 - Booster for Pfizer series) 03/12/2022 (Originally 08/18/2020)   DEXA SCAN  05/12/2022 (Originally 09/11/1994)   Zoster Vaccines- Shingrix (1 of 2) 05/27/2022 (Originally [birth year]11/1948)   TETANUS/TDAP  02/25/2023 (Originally [birth year]/1948)   MAMMOGRAM  12/25/2036 (Originally 07/03/2015)   INFLUENZA VACCINE  04/11/2022   OPHTHALMOLOGY EXAM  02/23/2023   Pneumonia Vaccine 74+ Years old  Completed   HPV VACCINES  Aged Out   Health Maintenance Health Maintenance Due  Topic Date Due   FOOT EXAM  12/14/2021   Lung Cancer Screening: (Low Dose CT Chest recommended if Age 70-80 years, 30 pack-year currently smoking OR have quit w/in 15years.) does not qualify.   Hepatitis C Screening: does not qualify.  Vision Screening: Recommended annual ophthalmology exams for early detection of glaucoma and other disorders of the eye.  Dental Screening: Recommended annual dental exams for proper oral hygiene.   Dexa Scan- deferred per patient preference.   Community Resource Referral /  Chronic Care Management: CRR required this visit?  No   CCM required this visit?  No      Plan:   Keep all routine maintenance appointments.   I have personally reviewed and noted the following in the patient's chart:   Medical and social history Use of alcohol, tobacco or illicit drugs  Current medications and supplements including opioid prescriptions.  Functional ability and status Nutritional status Physical activity Advanced directives List of other physicians Hospitalizations, surgeries, and ER visits in previous 12 months Vitals Screenings to include cognitive, depression, and falls Referrals and appointments  In addition, I have reviewed and discussed with patient certain preventive protocols, quality metrics, and best practice recommendations. A written personalized care plan for preventive services as well as general preventive health recommendations were provided to patient.     Varney Biles, LPN   0/11/7046

## 2022-03-16 LAB — HM DIABETES EYE EXAM

## 2022-04-10 ENCOUNTER — Encounter: Payer: Self-pay | Admitting: Internal Medicine

## 2022-04-10 ENCOUNTER — Ambulatory Visit (INDEPENDENT_AMBULATORY_CARE_PROVIDER_SITE_OTHER): Payer: Medicare Other | Admitting: Internal Medicine

## 2022-04-10 VITALS — BP 138/70 | HR 60 | Temp 98.9°F | Resp 15 | Ht 63.0 in | Wt 153.2 lb

## 2022-04-10 DIAGNOSIS — E1165 Type 2 diabetes mellitus with hyperglycemia: Secondary | ICD-10-CM

## 2022-04-10 DIAGNOSIS — E7800 Pure hypercholesterolemia, unspecified: Secondary | ICD-10-CM

## 2022-04-10 DIAGNOSIS — I10 Essential (primary) hypertension: Secondary | ICD-10-CM

## 2022-04-10 DIAGNOSIS — D649 Anemia, unspecified: Secondary | ICD-10-CM

## 2022-04-10 LAB — CBC WITH DIFFERENTIAL/PLATELET
Basophils Absolute: 0.1 10*3/uL (ref 0.0–0.1)
Basophils Relative: 0.9 % (ref 0.0–3.0)
Eosinophils Absolute: 0.2 10*3/uL (ref 0.0–0.7)
Eosinophils Relative: 2.9 % (ref 0.0–5.0)
HCT: 38 % (ref 36.0–46.0)
Hemoglobin: 12.9 g/dL (ref 12.0–15.0)
Lymphocytes Relative: 39.6 % (ref 12.0–46.0)
Lymphs Abs: 3.3 10*3/uL (ref 0.7–4.0)
MCHC: 33.9 g/dL (ref 30.0–36.0)
MCV: 88.4 fl (ref 78.0–100.0)
Monocytes Absolute: 0.8 10*3/uL (ref 0.1–1.0)
Monocytes Relative: 9.2 % (ref 3.0–12.0)
Neutro Abs: 3.9 10*3/uL (ref 1.4–7.7)
Neutrophils Relative %: 47.4 % (ref 43.0–77.0)
Platelets: 248 10*3/uL (ref 150.0–400.0)
RBC: 4.3 Mil/uL (ref 3.87–5.11)
RDW: 13.1 % (ref 11.5–15.5)
WBC: 8.3 10*3/uL (ref 4.0–10.5)

## 2022-04-10 LAB — HEMOGLOBIN A1C: Hgb A1c MFr Bld: 7.1 % — ABNORMAL HIGH (ref 4.6–6.5)

## 2022-04-10 LAB — BASIC METABOLIC PANEL
BUN: 29 mg/dL — ABNORMAL HIGH (ref 6–23)
CO2: 30 mEq/L (ref 19–32)
Calcium: 10 mg/dL (ref 8.4–10.5)
Chloride: 100 mEq/L (ref 96–112)
Creatinine, Ser: 1.13 mg/dL (ref 0.40–1.20)
GFR: 42.21 mL/min — ABNORMAL LOW (ref >60.00)
Glucose, Bld: 125 mg/dL — ABNORMAL HIGH (ref 70–99)
Potassium: 3.8 mEq/L (ref 3.5–5.1)
Sodium: 141 mEq/L (ref 135–145)

## 2022-04-10 LAB — HEPATIC FUNCTION PANEL
ALT: 17 U/L (ref 0–35)
AST: 15 U/L (ref 0–37)
Albumin: 4.3 g/dL (ref 3.5–5.2)
Alkaline Phosphatase: 65 U/L (ref 39–117)
Bilirubin, Direct: 0.1 mg/dL (ref 0.0–0.3)
Total Bilirubin: 0.7 mg/dL (ref 0.2–1.2)
Total Protein: 7 g/dL (ref 6.0–8.3)

## 2022-04-10 LAB — LIPID PANEL
Cholesterol: 318 mg/dL — ABNORMAL HIGH (ref 0–200)
HDL: 56.1 mg/dL (ref >39.00)
NonHDL: 261.46
Total CHOL/HDL Ratio: 6
Triglycerides: 384 mg/dL — ABNORMAL HIGH (ref 0.0–149.0)
VLDL: 76.8 mg/dL — ABNORMAL HIGH (ref 0.0–40.0)

## 2022-04-10 LAB — HM DIABETES FOOT EXAM

## 2022-04-10 LAB — TSH: TSH: 3.58 u[IU]/mL (ref 0.35–5.50)

## 2022-04-10 MED ORDER — TELMISARTAN 40 MG PO TABS: 40.0000 mg | ORAL_TABLET | Freq: Every day | ORAL | 3 refills | Status: DC

## 2022-04-10 MED ORDER — TRIAMTERENE-HCTZ 37.5-25 MG PO TABS: 0.5000 | ORAL_TABLET | Freq: Every day | ORAL | 1 refills | Status: DC

## 2022-04-10 NOTE — Assessment & Plan Note (Signed)
Low carb diet and exercise.  Follow met b and a1c.

## 2022-04-10 NOTE — Assessment & Plan Note (Signed)
Continue triam/hctz and micardis.  Follow pressures.  Check metabolic panel. Blood pressure as outlined.  Hold on making changes.  

## 2022-04-10 NOTE — Progress Notes (Signed)
Patient ID: Tina Watson, female   DOB: 12/28/1927, 88 y.o.   MRN: [id number]   Subjective:    Patient ID: Tina Watson, female    DOB: January 07, 1928, 88 y.o.   MRN: [id number]  Here for a scheduled follow up:   Chief Complaint  Patient presents with   Hypertension   Diabetes   Hyperlipidemia   Anemia   .   Hypertension Pertinent negatives include no chest pain, headaches, palpitations or shortness of breath.  Diabetes Pertinent negatives for hypoglycemia include no dizziness or headaches. Pertinent negatives for diabetes include no chest pain.  Hyperlipidemia Pertinent negatives include no chest pain, myalgias or shortness of breath.  Anemia There has been no abdominal pain, light-headedness or palpitations.   States she is doing relatively well.  Has not been watching her diet as well.  Not exercising regularly.  No chest pain.  Feels breathing is stable.  No cough or congestion.  Had covid 08/2021.  No residual problems.  No acid reflux.  No abdominal pain.  Bowels moving.  States blood pressures at home average 135-140/70-80.    Past Medical History:  Diagnosis Date   Anemia    Cancer ([place])    Skin   Chicken pox    Colon polyps    Diabetes mellitus without complication ([place])    Diverticulosis    Granuloma annulare    Hypercholesterolemia    Hypertension    Osteoporosis    Past Surgical History:  Procedure Laterality Date   MOHS SURGERY  07/2014   BCCA on nose   TONSILLECTOMY  1952   vaginal hysterectomy with anterior repair  1990   Family History  Problem Relation Age of Onset   Stroke Father    Kidney failure Father    Cancer Mother        question of rectum/vaginal   Heart disease Brother        x4   Stroke Sister    Dementia Sister    Pneumonia Sister    Breast cancer Neg Hx    Colon cancer Neg Hx    Social History   Socioeconomic History   Marital status: Widowed    Spouse name: Not on file   Number of children: 2   Years of education: Not on file    Highest education level: Not on file  Occupational History   Not on file  Tobacco Use   Smoking status: Never   Smokeless tobacco: Never  Substance and Sexual Activity   Alcohol use: No    Alcohol/week: 0.0 standard drinks of alcohol   Drug use: No   Sexual activity: Never  Other Topics Concern   Not on file  Social History Narrative   Not on file   Social Determinants of Health   Financial Resource Strain: Low Risk  (02/23/2021)   Overall Financial Resource Strain (CARDIA)    Difficulty of Paying Living Expenses: Not hard at all  Food Insecurity: No Food Insecurity (02/23/2021)   Hunger Vital Sign    Worried About Running Out of Food in the Last Year: Never true    Ran Out of Food in the Last Year: Never true  Transportation Needs: No Transportation Needs (02/23/2021)   PRAPARE - Hydrologist (Medical): No    Lack of Transportation (Non-Medical): No  Physical Activity: Unknown (02/20/2019)   Exercise Vital Sign    Days of Exercise per Week: 0 days    Minutes of Exercise  per Session: Not on file  Stress: No Stress Concern Present (02/23/2021)   [place]    Feeling of Stress : Not at all  Social Connections: Unknown (02/23/2021)   Social Connection and Isolation Panel [NHANES]    Frequency of Communication with Friends and Family: More than three times a week    Frequency of Social Gatherings with Friends and Family: Not on file    Attends Religious Services: Not on file    Active Member of Clubs or Organizations: Not on file    Attends Archivist Meetings: Not on file    Marital Status: Not on file     Review of Systems  Constitutional:  Negative for appetite change and unexpected weight change.  HENT:  Negative for congestion and sinus pressure.   Respiratory:  Negative for cough, chest tightness and shortness of breath.   Cardiovascular:  Negative for chest pain,  palpitations and leg swelling.  Gastrointestinal:  Negative for abdominal pain, diarrhea, nausea and vomiting.  Genitourinary:  Negative for difficulty urinating and dysuria.  Musculoskeletal:  Negative for joint swelling and myalgias.  Skin:  Negative for color change and rash.  Neurological:  Negative for dizziness, light-headedness and headaches.  Psychiatric/Behavioral:  Negative for agitation and dysphoric mood.        Objective:     BP 138/70   Pulse 60   Temp 98.9 F (37.2 C) (Temporal)   Resp 15   Ht _0  (1.6 m)   Wt 153 lb 3.2 oz (69.5 kg)   LMP 09/13/1984   SpO2 98%   BMI 27.14 kg/m  Wt Readings from Last 3 Encounters:  04/10/22 153 lb 3.2 oz (69.5 kg)  02/24/22 154 lb (69.9 kg)  08/25/21 150 lb (68 kg)    Physical Exam   Outpatient Encounter Medications as of 04/10/2022  Medication Sig   Calcium Carbonate-Vitamin D 600-400 MG-UNIT tablet Take 1 tablet by mouth daily.   ferrous sulfate 325 (65 FE) MG tablet TAKE 1 TABLET BY MOUTH DAILY   fish oil-omega-3 fatty acids 1000 MG capsule Take 1 g by mouth daily.   glucose blood (BAYER CONTOUR NEXT TEST) test strip TEST ONCE DAILY   latanoprost (XALATAN) 0.005 % ophthalmic solution Place 1 drop into both eyes at bedtime.   Multiple Vitamin (MULTIVITAMIN) tablet Take 1 tablet by mouth daily.   [DISCONTINUED] telmisartan (MICARDIS) 40 MG tablet TAKE 1 TABLET BY MOUTH EVERY DAY   [DISCONTINUED] triamterene-hydrochlorothiazide (MAXZIDE-25) 37.5-25 MG tablet TAKE 1/2 TABLET BY MOUTH EVERY DAY   telmisartan (MICARDIS) 40 MG tablet Take 1 tablet (40 mg total) by mouth daily.   triamterene-hydrochlorothiazide (MAXZIDE-25) 37.5-25 MG tablet Take 0.5 tablets by mouth daily.   No facility-administered encounter medications on file as of 04/10/2022.     Lab Results  Component Value Date   WBC 8.4 08/18/2020   HGB 13.6 08/18/2020   HCT 40.6 08/18/2020   PLT 260.0 08/18/2020   GLUCOSE 122 (H) 04/20/2021   CHOL 369 (H)  04/20/2021   TRIG (H) 04/20/2021    595.0 Triglyceride is over 400; calculations on Lipids are invalid.   HDL 63.60 04/20/2021   LDLDIRECT 200.0 04/20/2021   ALT 22 04/20/2021   AST 19 04/20/2021   NA 137 04/20/2021   K 4.2 04/20/2021   CL 97 04/20/2021   CREATININE 1.03 04/20/2021   BUN 24 (H) 04/20/2021   CO2 30 04/20/2021   TSH 2.98 08/18/2020  HGBA1C 7.0 (H) 04/20/2021   MICROALBUR 1.1 08/18/2020       Assessment & Plan:   Problem List Items Addressed This Visit     Anemia    Follow cbc.  Recheck today.       Relevant Orders   CBC w/Diff   Diabetes ([place]) - Primary    Low carb diet and exercise.  Follow met b and a1c.       Relevant Medications   telmisartan (MICARDIS) 40 MG tablet   Other Relevant Orders   HgB A1c   Microalbumin / creatinine urine ratio   Hypercholesterolemia    Intolerance to multiple statins. Off all cholesterol medication now. Desires not to try another medication.  Follow cholesterol.  Low cholesterol diet and exercise.  Follow lipid panel.        Relevant Medications   telmisartan (MICARDIS) 40 MG tablet   triamterene-hydrochlorothiazide (MAXZIDE-25) 37.5-25 MG tablet   Other Relevant Orders   Lipid Profile   Hepatic function panel   TSH   Hypertension    Continue triam/hctz and micardis.  Follow pressures.  Check metabolic panel. Blood pressure as outlined.  Hold on making changes.       Relevant Medications   telmisartan (MICARDIS) 40 MG tablet   triamterene-hydrochlorothiazide (MAXZIDE-25) 37.5-25 MG tablet   Other Relevant Orders   Basic Metabolic Panel (BMET)     Einar Pheasant, MD

## 2022-04-10 NOTE — Assessment & Plan Note (Signed)
Intolerance to multiple statins. Off all cholesterol medication now. Desires not to try another medication.  Follow cholesterol.  Low cholesterol diet and exercise.  Follow lipid panel.   

## 2022-04-10 NOTE — Assessment & Plan Note (Signed)
Follow cbc.  Recheck today.   

## 2022-04-11 LAB — LDL CHOLESTEROL, DIRECT: Direct LDL: 174 mg/dL

## 2022-04-11 LAB — MICROALBUMIN / CREATININE URINE RATIO
Creatinine,U: 61.2 mg/dL
Microalb Creat Ratio: 1.1 mg/g (ref 0.0–30.0)
Microalb, Ur: <0.7 mg/dL (ref 0.0–1.9)

## 2022-06-15 DIAGNOSIS — Z23 Encounter for immunization: Secondary | ICD-10-CM | POA: Diagnosis not present

## 2022-07-19 DIAGNOSIS — Z872 Personal history of diseases of the skin and subcutaneous tissue: Secondary | ICD-10-CM | POA: Diagnosis not present

## 2022-07-19 DIAGNOSIS — Z85828 Personal history of other malignant neoplasm of skin: Secondary | ICD-10-CM | POA: Diagnosis not present

## 2022-07-19 DIAGNOSIS — L578 Other skin changes due to chronic exposure to nonionizing radiation: Secondary | ICD-10-CM | POA: Diagnosis not present

## 2022-07-19 DIAGNOSIS — L821 Other seborrheic keratosis: Secondary | ICD-10-CM | POA: Diagnosis not present

## 2022-07-19 DIAGNOSIS — Z859 Personal history of malignant neoplasm, unspecified: Secondary | ICD-10-CM | POA: Diagnosis not present

## 2022-08-11 ENCOUNTER — Ambulatory Visit (INDEPENDENT_AMBULATORY_CARE_PROVIDER_SITE_OTHER): Payer: Medicare Other | Admitting: Internal Medicine

## 2022-08-11 ENCOUNTER — Encounter: Payer: Self-pay | Admitting: Internal Medicine

## 2022-08-11 ENCOUNTER — Telehealth: Payer: Self-pay | Admitting: Internal Medicine

## 2022-08-11 VITALS — BP 130/78 | HR 81 | Temp 97.7°F | Ht 63.5 in | Wt 154.0 lb

## 2022-08-11 DIAGNOSIS — I10 Essential (primary) hypertension: Secondary | ICD-10-CM | POA: Diagnosis not present

## 2022-08-11 DIAGNOSIS — E7800 Pure hypercholesterolemia, unspecified: Secondary | ICD-10-CM

## 2022-08-11 DIAGNOSIS — E1165 Type 2 diabetes mellitus with hyperglycemia: Secondary | ICD-10-CM | POA: Diagnosis not present

## 2022-08-11 LAB — LIPID PANEL
Cholesterol: 302 mg/dL — ABNORMAL HIGH (ref 0–200)
HDL: 61.3 mg/dL (ref >39.00)
NonHDL: 240.47
Total CHOL/HDL Ratio: 5
Triglycerides: 363 mg/dL — ABNORMAL HIGH (ref 0.0–149.0)
VLDL: 72.6 mg/dL — ABNORMAL HIGH (ref 0.0–40.0)

## 2022-08-11 LAB — BASIC METABOLIC PANEL
BUN: 34 mg/dL — ABNORMAL HIGH (ref 6–23)
CO2: 31 mEq/L (ref 19–32)
Calcium: 9.2 mg/dL (ref 8.4–10.5)
Chloride: 100 mEq/L (ref 96–112)
Creatinine, Ser: 1.05 mg/dL (ref 0.40–1.20)
GFR: 45.99 mL/min — ABNORMAL LOW (ref >60.00)
Glucose, Bld: 126 mg/dL — ABNORMAL HIGH (ref 70–99)
Potassium: 4 mEq/L (ref 3.5–5.1)
Sodium: 138 mEq/L (ref 135–145)

## 2022-08-11 LAB — HEPATIC FUNCTION PANEL
ALT: 20 U/L (ref 0–35)
AST: 17 U/L (ref 0–37)
Albumin: 4.3 g/dL (ref 3.5–5.2)
Alkaline Phosphatase: 65 U/L (ref 39–117)
Bilirubin, Direct: 0.1 mg/dL (ref 0.0–0.3)
Total Bilirubin: 0.6 mg/dL (ref 0.2–1.2)
Total Protein: 6.9 g/dL (ref 6.0–8.3)

## 2022-08-11 LAB — LDL CHOLESTEROL, DIRECT: Direct LDL: 178 mg/dL

## 2022-08-11 LAB — HEMOGLOBIN A1C: Hgb A1c MFr Bld: 7.3 % — ABNORMAL HIGH (ref 4.6–6.5)

## 2022-08-11 NOTE — Progress Notes (Unsigned)
Patient ID: Tina Watson, female   DOB: February 27, 1928, 88 y.o.   MRN: [id number]   Subjective:    Patient ID: Tina Watson, female    DOB: Jun 20, 1928, 88 y.o.   MRN: [id number]   Patient here for  Chief Complaint  Patient presents with   Follow-up    4 mo   .   HPI Here to follow up regarding her blood pressure, diabetes and cholesterol.  Reports she is doing well.  Feels good.  Has been trying to watch her diet - carb intake.  Checking sugars.  States am sugars averaging 115-140s.  Reports this is higher than before she was watching her diet.  No chest pain.  No sob reported.  No cough or congestion.  No abdominal pain or bowel change reported.  Handling stress.     Past Medical History:  Diagnosis Date   Anemia    Cancer ([place])    Skin   Chicken pox    Colon polyps    Diabetes mellitus without complication ([place])    Diverticulosis    Granuloma annulare    Hypercholesterolemia    Hypertension    Osteoporosis    Past Surgical History:  Procedure Laterality Date   MOHS SURGERY  07/2014   BCCA on nose   TONSILLECTOMY  1952   vaginal hysterectomy with anterior repair  1990   Family History  Problem Relation Age of Onset   Stroke Father    Kidney failure Father    Cancer Mother        question of rectum/vaginal   Heart disease Brother        x4   Stroke Sister    Dementia Sister    Pneumonia Sister    Breast cancer Neg Hx    Colon cancer Neg Hx    Social History   Socioeconomic History   Marital status: Widowed    Spouse name: Not on file   Number of children: 2   Years of education: Not on file   Highest education level: Not on file  Occupational History   Not on file  Tobacco Use   Smoking status: Never   Smokeless tobacco: Never  Substance and Sexual Activity   Alcohol use: No    Alcohol/week: 0.0 standard drinks of alcohol   Drug use: No   Sexual activity: Never  Other Topics Concern   Not on file  Social History Narrative   Not on file   Social  Determinants of Health   Financial Resource Strain: Low Risk  (02/23/2021)   Overall Financial Resource Strain (CARDIA)    Difficulty of Paying Living Expenses: Not hard at all  Food Insecurity: No Food Insecurity (02/23/2021)   Hunger Vital Sign    Worried About Running Out of Food in the Last Year: Never true    Ran Out of Food in the Last Year: Never true  Transportation Needs: No Transportation Needs (02/23/2021)   PRAPARE - Hydrologist (Medical): No    Lack of Transportation (Non-Medical): No  Physical Activity: Unknown (02/20/2019)   Exercise Vital Sign    Days of Exercise per Week: 0 days    Minutes of Exercise per Session: Not on file  Stress: No Stress Concern Present (02/23/2021)   Yellow Medicine    Feeling of Stress : Not at all  Social Connections: Unknown (02/23/2021)   Social Connection and Isolation Panel [NHANES]  Frequency of Communication with Friends and Family: More than three times a week    Frequency of Social Gatherings with Friends and Family: Not on file    Attends Religious Services: Not on file    Active Member of Clubs or Organizations: Not on file    Attends Archivist Meetings: Not on file    Marital Status: Not on file     Review of Systems  Constitutional:  Negative for appetite change and unexpected weight change.  HENT:  Negative for congestion and sinus pressure.   Respiratory:  Negative for cough, chest tightness and shortness of breath.   Cardiovascular:  Negative for chest pain, palpitations and leg swelling.  Gastrointestinal:  Negative for abdominal pain, diarrhea, nausea and vomiting.  Genitourinary:  Negative for difficulty urinating and dysuria.  Musculoskeletal:  Negative for joint swelling and myalgias.  Skin:  Negative for color change and rash.  Neurological:  Negative for dizziness, light-headedness and headaches.   Psychiatric/Behavioral:  Negative for agitation and dysphoric mood.        Objective:     BP 130/78   Pulse 81   Temp 97.7 F (36.5 C) (Oral)   Ht 5' 3.5" (1.613 m)   Wt 154 lb (69.9 kg)   LMP 09/13/1984   SpO2 96%   BMI 26.85 kg/m  Wt Readings from Last 3 Encounters:  08/11/22 154 lb (69.9 kg)  04/10/22 153 lb 3.2 oz (69.5 kg)  02/24/22 154 lb (69.9 kg)    Physical Exam Vitals reviewed.  Constitutional:      General: She is not in acute distress.    Appearance: Normal appearance.  HENT:     Head: Normocephalic and atraumatic.     Right Ear: External ear normal.     Left Ear: External ear normal.  Eyes:     General: No scleral icterus.       Right eye: No discharge.        Left eye: No discharge.     Conjunctiva/sclera: Conjunctivae normal.  Neck:     Thyroid: No thyromegaly.  Cardiovascular:     Rate and Rhythm: Normal rate and regular rhythm.  Pulmonary:     Effort: No respiratory distress.     Breath sounds: Normal breath sounds. No wheezing.  Abdominal:     General: Bowel sounds are normal.     Palpations: Abdomen is soft.     Tenderness: There is no abdominal tenderness.  Musculoskeletal:        General: No swelling or tenderness.     Cervical back: Neck supple. No tenderness.  Lymphadenopathy:     Cervical: No cervical adenopathy.  Skin:    Findings: No erythema or rash.  Neurological:     Mental Status: She is alert.  Psychiatric:        Mood and Affect: Mood normal.        Behavior: Behavior normal.      Outpatient Encounter Medications as of 08/11/2022  Medication Sig   Calcium Carbonate-Vitamin D 600-400 MG-UNIT tablet Take 1 tablet by mouth daily.   ferrous sulfate 325 (65 FE) MG tablet TAKE 1 TABLET BY MOUTH DAILY   fish oil-omega-3 fatty acids 1000 MG capsule Take 1 g by mouth daily.   glucose blood (BAYER CONTOUR NEXT TEST) test strip TEST ONCE DAILY   latanoprost (XALATAN) 0.005 % ophthalmic solution Place 1 drop into both eyes at  bedtime.   Multiple Vitamin (MULTIVITAMIN) tablet Take 1 tablet by mouth  daily.   telmisartan (MICARDIS) 40 MG tablet Take 1 tablet (40 mg total) by mouth daily.   triamterene-hydrochlorothiazide (MAXZIDE-25) 37.5-25 MG tablet Take 0.5 tablets by mouth daily.   No facility-administered encounter medications on file as of 08/11/2022.     Lab Results  Component Value Date   WBC 8.3 04/10/2022   HGB 12.9 04/10/2022   HCT 38.0 04/10/2022   PLT 248.0 04/10/2022   GLUCOSE 126 (H) 08/11/2022   CHOL 302 (H) 08/11/2022   TRIG 363.0 (H) 08/11/2022   HDL 61.30 08/11/2022   LDLDIRECT 178.0 08/11/2022   ALT 20 08/11/2022   AST 17 08/11/2022   NA 138 08/11/2022   K 4.0 08/11/2022   CL 100 08/11/2022   CREATININE 1.05 08/11/2022   BUN 34 (H) 08/11/2022   CO2 31 08/11/2022   TSH 3.58 04/10/2022   HGBA1C 7.3 (H) 08/11/2022   MICROALBUR <0.7 04/10/2022       Assessment & Plan:   Problem List Items Addressed This Visit     Diabetes ([place])    Trying to watch her diet.  Sugars as outlined.  Low carb diet and exercise.  Follow met b and a1c.       Relevant Orders   Hemoglobin A1c (Completed)   Hypercholesterolemia - Primary    Intolerance to multiple statins. Off all cholesterol medication now. Desires not to try another medication.  Follow cholesterol.  Low cholesterol diet and exercise.  Follow lipid panel.        Relevant Orders   Lipid panel (Completed)   Hepatic function panel (Completed)   Hypertension    Continue triam/hctz and micardis.  Follow pressures.  Check metabolic panel. Blood pressure as outlined.  Hold on making changes.       Relevant Orders   Basic metabolic panel (Completed)     Einar Pheasant, MD

## 2022-08-11 NOTE — Telephone Encounter (Signed)
Pt called stating cindy and Jakhiya had two shots-pneumonia and RSV at [place] on 06/15/2022

## 2022-08-13 ENCOUNTER — Encounter: Payer: Self-pay | Admitting: Internal Medicine

## 2022-08-13 NOTE — Assessment & Plan Note (Signed)
Trying to watch her diet.  Sugars as outlined.  Low carb diet and exercise.  Follow met b and a1c.

## 2022-08-13 NOTE — Assessment & Plan Note (Signed)
Intolerance to multiple statins. Off all cholesterol medication now. Desires not to try another medication.  Follow cholesterol.  Low cholesterol diet and exercise.  Follow lipid panel.

## 2022-08-13 NOTE — Assessment & Plan Note (Signed)
Continue triam/hctz and micardis.  Follow pressures.  Check metabolic panel. Blood pressure as outlined.  Hold on making changes.

## 2022-08-15 NOTE — Telephone Encounter (Signed)
Documented in both pt charts

## 2022-08-17 DIAGNOSIS — H2513 Age-related nuclear cataract, bilateral: Secondary | ICD-10-CM | POA: Diagnosis not present

## 2022-09-08 ENCOUNTER — Other Ambulatory Visit: Payer: Self-pay | Admitting: Internal Medicine

## 2022-10-03 DIAGNOSIS — H6063 Unspecified chronic otitis externa, bilateral: Secondary | ICD-10-CM | POA: Diagnosis not present

## 2023-02-13 DIAGNOSIS — H40003 Preglaucoma, unspecified, bilateral: Secondary | ICD-10-CM | POA: Diagnosis not present

## 2023-02-15 DIAGNOSIS — H43813 Vitreous degeneration, bilateral: Secondary | ICD-10-CM | POA: Diagnosis not present

## 2023-02-15 DIAGNOSIS — H401131 Primary open-angle glaucoma, bilateral, mild stage: Secondary | ICD-10-CM | POA: Diagnosis not present

## 2023-02-15 DIAGNOSIS — E119 Type 2 diabetes mellitus without complications: Secondary | ICD-10-CM | POA: Diagnosis not present

## 2023-02-15 DIAGNOSIS — H2513 Age-related nuclear cataract, bilateral: Secondary | ICD-10-CM | POA: Diagnosis not present

## 2023-02-15 LAB — HM DIABETES EYE EXAM

## 2023-03-21 DIAGNOSIS — H60543 Acute eczematoid otitis externa, bilateral: Secondary | ICD-10-CM | POA: Diagnosis not present

## 2023-03-26 ENCOUNTER — Ambulatory Visit: Payer: Medicare Other | Admitting: Internal Medicine

## 2023-03-29 ENCOUNTER — Ambulatory Visit (INDEPENDENT_AMBULATORY_CARE_PROVIDER_SITE_OTHER): Payer: Medicare Other | Admitting: Internal Medicine

## 2023-03-29 ENCOUNTER — Encounter: Payer: Self-pay | Admitting: Internal Medicine

## 2023-03-29 VITALS — BP 138/78 | HR 78 | Temp 98.0°F | Resp 17 | Ht 63.5 in | Wt 154.0 lb

## 2023-03-29 DIAGNOSIS — T466X5A Adverse effect of antihyperlipidemic and antiarteriosclerotic drugs, initial encounter: Secondary | ICD-10-CM | POA: Diagnosis not present

## 2023-03-29 DIAGNOSIS — G720 Drug-induced myopathy: Secondary | ICD-10-CM | POA: Diagnosis not present

## 2023-03-29 DIAGNOSIS — D649 Anemia, unspecified: Secondary | ICD-10-CM

## 2023-03-29 DIAGNOSIS — E1165 Type 2 diabetes mellitus with hyperglycemia: Secondary | ICD-10-CM | POA: Diagnosis not present

## 2023-03-29 DIAGNOSIS — I10 Essential (primary) hypertension: Secondary | ICD-10-CM

## 2023-03-29 DIAGNOSIS — E7800 Pure hypercholesterolemia, unspecified: Secondary | ICD-10-CM

## 2023-03-29 NOTE — Progress Notes (Unsigned)
Subjective:    Patient ID: Tina Watson, female    DOB: 02/09/1928, 89 y.o.   MRN: [id number]  Patient here for  Chief Complaint  Patient presents with   Blood Sugar Problem    C/O elevated Blood sugar was 133 this morning (did not bring readings or machine this morning), has seen it up to 145. Admits that she doesn't drik enough fluids throughout the day. Still voids normally even with minimum fluid intake    HPI Here as a work in to discuss elevated blood sugar.  Reports she is doing relatively well.  Tries to stay active.  No chest pain or sob reported.  No cough or congestion. States blood sugar this am 133.  Recently has noticed 140s.  States previous blood sugars averaging 115-120. Has decreased snacks. Discussed her eating schedule. She eats her evening meals early and then does not eat again until the next morning - around 8-9:00.  Discussed trial of eating a protein snack later in the evening.    Past Medical History:  Diagnosis Date   Anemia    Cancer ([facility])    Skin   Chicken pox    Colon polyps    Diabetes mellitus without complication ([facility])    Diverticulosis    Granuloma annulare    Hypercholesterolemia    Hypertension    Osteoporosis    Past Surgical History:  Procedure Laterality Date   MOHS SURGERY  07/2014   BCCA on nose   TONSILLECTOMY  1952   vaginal hysterectomy with anterior repair  1990   Family History  Problem Relation Age of Onset   Stroke Father    Kidney failure Father    Cancer Mother        question of rectum/vaginal   Heart disease Brother        x4   Stroke Sister    Dementia Sister    Pneumonia Sister    Breast cancer Neg Hx    Colon cancer Neg Hx    Social History   Socioeconomic History   Marital status: Widowed    Spouse name: Not on file   Number of children: 2   Years of education: Not on file   Highest education level: Not on file  Occupational History   Not on file  Tobacco Use   Smoking status: Never   Smokeless  tobacco: Never  Substance and Sexual Activity   Alcohol use: No    Alcohol/week: 0.0 standard drinks of alcohol   Drug use: No   Sexual activity: Never  Other Topics Concern   Not on file  Social History Narrative   Not on file   Social Determinants of Health   Financial Resource Strain: Low Risk  (02/23/2021)   Overall Financial Resource Strain (CARDIA)    Difficulty of Paying Living Expenses: Not hard at all  Food Insecurity: No Food Insecurity (02/23/2021)   Hunger Vital Sign    Worried About Running Out of Food in the Last Year: Never true    Ran Out of Food in the Last Year: Never true  Transportation Needs: No Transportation Needs (02/23/2021)   PRAPARE - Administrator, Civil Service (Medical): No    Lack of Transportation (Non-Medical): No  Physical Activity: Unknown (02/20/2019)   Exercise Vital Sign    Days of Exercise per Week: 0 days    Minutes of Exercise per Session: Not on file  Stress: No Stress Concern Present (02/23/2021)  [employer] of Occupational Health - Occupational Stress Questionnaire    Feeling of Stress : Not at all  Social Connections: Unknown (02/23/2021)   Social Connection and Isolation Panel [NHANES]    Frequency of Communication with Friends and Family: More than three times a week    Frequency of Social Gatherings with Friends and Family: Not on file    Attends Religious Services: Not on file    Active Member of Clubs or Organizations: Not on file    Attends Banker Meetings: Not on file    Marital Status: Not on file     Review of Systems  Constitutional:  Negative for appetite change and unexpected weight change.  HENT:  Negative for congestion and sinus pressure.   Respiratory:  Negative for cough, chest tightness and shortness of breath.   Cardiovascular:  Negative for chest pain, palpitations and leg swelling.  Gastrointestinal:  Negative for abdominal pain, diarrhea, nausea and vomiting.  Genitourinary:   Negative for difficulty urinating and dysuria.  Musculoskeletal:  Negative for joint swelling and myalgias.  Skin:  Negative for color change and rash.  Neurological:  Negative for dizziness and headaches.  Psychiatric/Behavioral:  Negative for agitation and dysphoric mood.        Objective:     BP 138/78   Pulse 78   Temp 98 F (36.7 C) (Oral)   Resp 17   Ht 5' 3.5" (1.613 m)   Wt 154 lb (69.9 kg)   LMP 09/13/1984   SpO2 95%   BMI 26.85 kg/m  Wt Readings from Last 3 Encounters:  03/29/23 154 lb (69.9 kg)  08/11/22 154 lb (69.9 kg)  04/10/22 153 lb 3.2 oz (69.5 kg)    Physical Exam Vitals reviewed.  Constitutional:      General: She is not in acute distress.    Appearance: Normal appearance.  HENT:     Head: Normocephalic and atraumatic.     Right Ear: External ear normal.     Left Ear: External ear normal.  Eyes:     General: No scleral icterus.       Right eye: No discharge.        Left eye: No discharge.     Conjunctiva/sclera: Conjunctivae normal.  Neck:     Thyroid: No thyromegaly.  Cardiovascular:     Rate and Rhythm: Normal rate and regular rhythm.  Pulmonary:     Effort: No respiratory distress.     Breath sounds: Normal breath sounds. No wheezing.  Abdominal:     General: Bowel sounds are normal.     Palpations: Abdomen is soft.     Tenderness: There is no abdominal tenderness.  Musculoskeletal:        General: No swelling or tenderness.     Cervical back: Neck supple. No tenderness.  Lymphadenopathy:     Cervical: No cervical adenopathy.  Skin:    Findings: No erythema or rash.  Neurological:     Mental Status: She is alert.  Psychiatric:        Mood and Affect: Mood normal.        Behavior: Behavior normal.      Outpatient Encounter Medications as of 03/29/2023  Medication Sig   Calcium Carbonate-Vitamin D 600-400 MG-UNIT tablet Take 1 tablet by mouth daily.   ferrous sulfate 325 (65 FE) MG tablet TAKE 1 TABLET BY MOUTH DAILY   fish  oil-omega-3 fatty acids 1000 MG capsule Take 1 g by mouth daily.  glucose blood (BAYER CONTOUR NEXT TEST) test strip TEST ONCE DAILY   latanoprost (XALATAN) 0.005 % ophthalmic solution Place 1 drop into both eyes at bedtime.   mometasone (ELOCON) 0.1 % cream 2 (two) times daily.   mometasone (ELOCON) 0.1 % lotion SMARTSIG:In Ear(s)   Multiple Vitamin (MULTIVITAMIN) tablet Take 1 tablet by mouth daily.   telmisartan (MICARDIS) 40 MG tablet Take 1 tablet (40 mg total) by mouth daily.   triamterene-hydrochlorothiazide (MAXZIDE-25) 37.5-25 MG tablet TAKE 1/2 TABLET BY MOUTH DAILY   No facility-administered encounter medications on file as of 03/29/2023.     Lab Results  Component Value Date   WBC 8.3 04/10/2022   HGB 12.9 04/10/2022   HCT 38.0 04/10/2022   PLT 248.0 04/10/2022   GLUCOSE 126 (H) 08/11/2022   CHOL 302 (H) 08/11/2022   TRIG 363.0 (H) 08/11/2022   HDL 61.30 08/11/2022   LDLDIRECT 178.0 08/11/2022   ALT 20 08/11/2022   AST 17 08/11/2022   NA 138 08/11/2022   K 4.0 08/11/2022   CL 100 08/11/2022   CREATININE 1.05 08/11/2022   BUN 34 (H) 08/11/2022   CO2 31 08/11/2022   TSH 3.58 04/10/2022   HGBA1C 7.3 (H) 08/11/2022   MICROALBUR <0.7 04/10/2022       Assessment & Plan:  Type 2 diabetes mellitus with hyperglycemia, without long-term current use of insulin ([facility]) Assessment & Plan: Trying to watch her diet.  Sugars as outlined.  Low carb diet and exercise.  Follow met b and a1c. Overdue labs.  Arrange fasting labs.  Discussed eating protein snack in the evening.  Continue to follow sugars.   Orders: -     Basic metabolic panel; Future -     Hemoglobin A1c; Future  Hypercholesterolemia Assessment & Plan: Intolerance to multiple statins. Off all cholesterol medication now. Desires not to try another medication.  Follow cholesterol.  Low cholesterol diet and exercise.  Follow lipid panel.    Orders: -     CBC with Differential/Platelet; Future -     Hepatic  function panel; Future -     Lipid panel; Future -     TSH; Future  Primary hypertension Assessment & Plan: Continue triam/hctz and micardis.  Follow pressures.  Check metabolic panel. Blood pressure as outlined.  Hold on making changes.    Anemia, unspecified type Assessment & Plan: Follow cbc.    Statin myopathy Assessment & Plan: Intolerance to statins.        Dale Ventura, MD

## 2023-04-01 ENCOUNTER — Encounter: Payer: Self-pay | Admitting: Internal Medicine

## 2023-04-01 DIAGNOSIS — G720 Drug-induced myopathy: Secondary | ICD-10-CM | POA: Insufficient documentation

## 2023-04-01 NOTE — Assessment & Plan Note (Signed)
Intolerance to multiple statins. Off all cholesterol medication now. Desires not to try another medication.  Follow cholesterol.  Low cholesterol diet and exercise.  Follow lipid panel.

## 2023-04-01 NOTE — Assessment & Plan Note (Signed)
Follow cbc.  

## 2023-04-01 NOTE — Assessment & Plan Note (Signed)
Intolerance to statins.

## 2023-04-01 NOTE — Assessment & Plan Note (Signed)
Continue triam/hctz and micardis.  Follow pressures.  Check metabolic panel. Blood pressure as outlined.  Hold on making changes.  

## 2023-04-01 NOTE — Assessment & Plan Note (Signed)
Trying to watch her diet.  Sugars as outlined.  Low carb diet and exercise.  Follow met b and a1c. Overdue labs.  Arrange fasting labs.  Discussed eating protein snack in the evening.  Continue to follow sugars.

## 2023-04-05 ENCOUNTER — Other Ambulatory Visit (INDEPENDENT_AMBULATORY_CARE_PROVIDER_SITE_OTHER): Payer: Medicare Other

## 2023-04-05 DIAGNOSIS — E1165 Type 2 diabetes mellitus with hyperglycemia: Secondary | ICD-10-CM | POA: Diagnosis not present

## 2023-04-05 DIAGNOSIS — E7800 Pure hypercholesterolemia, unspecified: Secondary | ICD-10-CM | POA: Diagnosis not present

## 2023-04-05 LAB — TSH: TSH: 2.91 u[IU]/mL (ref 0.35–5.50)

## 2023-04-05 LAB — CBC WITH DIFFERENTIAL/PLATELET
Basophils Absolute: 0.1 10*3/uL (ref 0.0–0.1)
Basophils Relative: 0.5 % (ref 0.0–3.0)
Eosinophils Absolute: 0.3 10*3/uL (ref 0.0–0.7)
Eosinophils Relative: 2.7 % (ref 0.0–5.0)
HCT: 40 % (ref 36.0–46.0)
Hemoglobin: 13.1 g/dL (ref 12.0–15.0)
Lymphocytes Relative: 35.4 % (ref 12.0–46.0)
Lymphs Abs: 3.8 10*3/uL (ref 0.7–4.0)
MCHC: 32.8 g/dL (ref 30.0–36.0)
MCV: 89.8 fl (ref 78.0–100.0)
Monocytes Absolute: 0.9 10*3/uL (ref 0.1–1.0)
Monocytes Relative: 8.5 % (ref 3.0–12.0)
Neutro Abs: 5.7 10*3/uL (ref 1.4–7.7)
Neutrophils Relative %: 52.9 % (ref 43.0–77.0)
Platelets: 267 10*3/uL (ref 150.0–400.0)
RBC: 4.45 Mil/uL (ref 3.87–5.11)
RDW: 13.6 % (ref 11.5–15.5)
WBC: 10.7 10*3/uL — ABNORMAL HIGH (ref 4.0–10.5)

## 2023-04-05 LAB — LIPID PANEL
Cholesterol: 346 mg/dL — ABNORMAL HIGH (ref 0–200)
HDL: 61.1 mg/dL (ref >39.00)
Total CHOL/HDL Ratio: 6
Triglycerides: 498 mg/dL — ABNORMAL HIGH (ref 0.0–149.0)

## 2023-04-05 LAB — HEPATIC FUNCTION PANEL
ALT: 17 U/L (ref 0–35)
AST: 17 U/L (ref 0–37)
Albumin: 4.4 g/dL (ref 3.5–5.2)
Alkaline Phosphatase: 77 U/L (ref 39–117)
Bilirubin, Direct: 0.1 mg/dL (ref 0.0–0.3)
Total Bilirubin: 0.6 mg/dL (ref 0.2–1.2)
Total Protein: 7.4 g/dL (ref 6.0–8.3)

## 2023-04-05 LAB — BASIC METABOLIC PANEL
BUN: 30 mg/dL — ABNORMAL HIGH (ref 6–23)
CO2: 30 mEq/L (ref 19–32)
Calcium: 10 mg/dL (ref 8.4–10.5)
Chloride: 98 mEq/L (ref 96–112)
Creatinine, Ser: 1.14 mg/dL (ref 0.40–1.20)
GFR: 41.48 mL/min — ABNORMAL LOW (ref >60.00)
Glucose, Bld: 126 mg/dL — ABNORMAL HIGH (ref 70–99)
Potassium: 4.1 mEq/L (ref 3.5–5.1)
Sodium: 138 mEq/L (ref 135–145)

## 2023-04-05 LAB — LDL CHOLESTEROL, DIRECT: Direct LDL: 192 mg/dL

## 2023-04-05 LAB — HEMOGLOBIN A1C: Hgb A1c MFr Bld: 7.2 % — ABNORMAL HIGH (ref 4.6–6.5)

## 2023-04-11 ENCOUNTER — Other Ambulatory Visit: Payer: Self-pay | Admitting: Internal Medicine

## 2023-04-11 DIAGNOSIS — H60543 Acute eczematoid otitis externa, bilateral: Secondary | ICD-10-CM | POA: Diagnosis not present

## 2023-05-03 ENCOUNTER — Ambulatory Visit (INDEPENDENT_AMBULATORY_CARE_PROVIDER_SITE_OTHER): Payer: Medicare Other | Admitting: Emergency Medicine

## 2023-05-03 VITALS — Ht 64.0 in | Wt 154.0 lb

## 2023-05-03 DIAGNOSIS — Z780 Asymptomatic menopausal state: Secondary | ICD-10-CM | POA: Diagnosis not present

## 2023-05-03 DIAGNOSIS — Z0000 Encounter for general adult medical examination without abnormal findings: Secondary | ICD-10-CM

## 2023-05-03 NOTE — Patient Instructions (Signed)
Tina Watson , Thank you for taking time to come for your Medicare Wellness Visit. I appreciate your ongoing commitment to your health goals. Please review the following plan we discussed and let me know if I can assist you in the future.   Referrals/Orders/Follow-Ups/Clinician Recommendations: Get the flu shot this fall. Get a tetanus shot if it has been 10 years or more since you have had one. I placed an order for a bone density test. Call [facility] @ [phone] to schedule an appointment.  This is a list of the screening recommended for you and due dates:  Health Maintenance  Topic Date Due   DTaP/Tdap/Td vaccine (1 - Tdap) Never done   Zoster (Shingles) Vaccine (1 of 2) Never done   DEXA scan (bone density measurement)  Never done   COVID-19 Vaccine (4 - 2023-24 season) 05/12/2022   Complete foot exam   04/11/2023   Flu Shot  04/12/2023   Mammogram  12/25/2036*   Hemoglobin A1C  10/06/2023   Eye exam for diabetics  02/15/2024   Medicare Annual Wellness Visit  05/02/2024   Pneumonia Vaccine  Completed   HPV Vaccine  Aged Out  *Topic was postponed. The date shown is not the original due date.    Advanced directives: (Copy Requested) Please bring a copy of your health care power of attorney and living will to the office to be added to your chart at your convenience.  Next Medicare Annual Wellness Visit scheduled for next year: Yes,

## 2023-05-03 NOTE — Progress Notes (Addendum)
Subjective:   Tina Watson is a 89 y.o. female who presents for Medicare Annual (Subsequent) preventive examination.  Visit Complete: Virtual  I connected with  Tina Watson on 05/03/23 by a audio enabled telemedicine application and verified that I am speaking with the correct person using two identifiers.  Patient Location: Home  Provider Location: Home Office  I discussed the limitations of evaluation and management by telemedicine. The patient expressed understanding and agreed to proceed.  Vital Signs: Because this visit was a virtual/telehealth visit, some criteria may be missing or patient reported. Any vitals not documented were not able to be obtained and vitals that have been documented are patient reported.    Review of Systems     Cardiac Risk Factors include: advanced age (>54men, >6 women);diabetes mellitus;dyslipidemia;hypertension     Objective:    Today's Vitals   05/03/23 1037  Weight: 154 lb (69.9 kg)  Height: 5\' 4"  (1.626 m)   Body mass index is 26.43 kg/m.     05/03/2023   10:50 AM 02/24/2022   10:26 AM 02/23/2021   11:40 AM 02/23/2020   11:12 AM 02/20/2019   11:48 AM 12/03/2017    2:01 PM 11/30/2016    1:24 PM  Advanced Directives  Does Patient Have a Medical Advance Directive? Yes Yes No Yes Yes Yes Yes  Type of Estate agent of [place];Living will Healthcare Power of [place];Living will  Healthcare Power of [place];Living will Living will;Healthcare Power of [employer] Power of [employer] Power of [place];Living will  Does patient want to make changes to medical advance directive? No - Patient declined No - Patient declined  No - Patient declined No - Patient declined No - Patient declined No - Patient declined  Copy of Healthcare Power of Attorney in Chart? No - copy requested No - copy requested  No - copy requested No - copy requested No - copy requested No - copy requested  Would patient like information on  creating a medical advance directive?   No - Patient declined        Current Medications (verified) Outpatient Encounter Medications as of 05/03/2023  Medication Sig   Calcium Carbonate-Vitamin D 600-400 MG-UNIT tablet Take 1 tablet by mouth daily.   ferrous sulfate 325 (65 FE) MG tablet TAKE 1 TABLET BY MOUTH DAILY   fish oil-omega-3 fatty acids 1000 MG capsule Take 1 g by mouth daily.   glucose blood (BAYER CONTOUR NEXT TEST) test strip TEST ONCE DAILY   latanoprost (XALATAN) 0.005 % ophthalmic solution Place 1 drop into both eyes at bedtime.   mometasone (ELOCON) 0.1 % cream 2 (two) times daily.   mometasone (ELOCON) 0.1 % lotion SMARTSIG:In Ear(s)   Multiple Vitamin (MULTIVITAMIN) tablet Take 1 tablet by mouth daily.   telmisartan (MICARDIS) 40 MG tablet TAKE 1 TABLET BY MOUTH EVERY DAY   triamterene-hydrochlorothiazide (MAXZIDE-25) 37.5-25 MG tablet TAKE 1/2 TABLET BY MOUTH DAILY   No facility-administered encounter medications on file as of 05/03/2023.    Allergies (verified) Demerol [meperidine], Fluvastatin, and Lescol [fluvastatin sodium]   History: Past Medical History:  Diagnosis Date   Anemia    Cancer (HCC)    Skin   Chicken pox    Colon polyps    Diabetes mellitus without complication (HCC)    Diverticulosis    Granuloma annulare    Hypercholesterolemia    Hypertension    Osteoporosis    Past Surgical History:  Procedure Laterality Date   MOHS  SURGERY  07/2014   BCCA on nose   TONSILLECTOMY  1952   vaginal hysterectomy with anterior repair  1990   Family History  Problem Relation Age of Onset   Cancer Mother        question of rectum/vaginal   Stroke Father    Kidney failure Father    Stroke Sister    Dementia Sister    Pneumonia Sister    Heart disease Brother        x4   Breast cancer Neg Hx    Colon cancer Neg Hx    Social History   Socioeconomic History   Marital status: Widowed    Spouse name: Not on file   Number of children: 2    Years of education: Not on file   Highest education level: Not on file  Occupational History   Not on file  Tobacco Use   Smoking status: Never   Smokeless tobacco: Never  Substance and Sexual Activity   Alcohol use: Yes    Comment: rare twice a year   Drug use: No   Sexual activity: Never  Other Topics Concern   Not on file  Social History Narrative   Widowed, 2 children, 1 daughter who lives with her   Social Determinants of Health   Financial Resource Strain: Low Risk  (05/03/2023)   Overall Financial Resource Strain (CARDIA)    Difficulty of Paying Living Expenses: Not hard at all  Food Insecurity: No Food Insecurity (05/03/2023)   Hunger Vital Sign    Worried About Running Out of Food in the Last Year: Never true    Ran Out of Food in the Last Year: Never true  Transportation Needs: No Transportation Needs (05/03/2023)   PRAPARE - Administrator, Civil Service (Medical): No    Lack of Transportation (Non-Medical): No  Physical Activity: Sufficiently Active (05/03/2023)   Exercise Vital Sign    Days of Exercise per Week: 7 days    Minutes of Exercise per Session: 30 min  Stress: No Stress Concern Present (05/03/2023)   [employer] of Occupational Health - Occupational Stress Questionnaire    Feeling of Stress : Only a little  Social Connections: Moderately Integrated (05/03/2023)   Social Connection and Isolation Panel [NHANES]    Frequency of Communication with Friends and Family: More than three times a week    Frequency of Social Gatherings with Friends and Family: Twice a week    Attends Religious Services: More than 4 times per year    Active Member of [employer] or Organizations: Yes    Attends Banker Meetings: More than 4 times per year    Marital Status: Widowed    Tobacco Counseling Counseling given: Not Answered   Clinical Intake:  Pre-visit preparation completed: Yes  Pain : No/denies pain     BMI - recorded:  26.43 Nutritional Status: BMI 25 -29 Overweight Nutritional Risks: None Diabetes: Yes CBG done?: Yes (FBS this am 130 per patient) CBG resulted in Enter/ Edit results?: No Did pt. bring in CBG monitor from home?: No  How often do you need to have someone help you when you read instructions, pamphlets, or other written materials from your doctor or pharmacy?: 1 - Never  Interpreter Needed?: No  Information entered by :: Tora Kindred, CMA   Activities of Daily Living    05/03/2023   10:42 AM  In your present state of health, do you have any difficulty performing the following  activities:  Hearing? 1  Comment wears hearing aids  Vision? 0  Difficulty concentrating or making decisions? 0  Walking or climbing stairs? 0  Dressing or bathing? 0  Doing errands, shopping? 0  Preparing Food and eating ? N  Using the Toilet? N  In the past six months, have you accidently leaked urine? Y  Comment wears a pad  Do you have problems with loss of bowel control? N  Managing your Medications? N  Managing your Finances? N  Housekeeping or managing your Housekeeping? N    Patient Care Team: Dale Morehead, MD as PCP - General (Internal Medicine)  Indicate any recent Medical Services you may have received from other than [facility] providers in the past year (date may be approximate).     Assessment:   This is a routine wellness examination for [place].  Hearing/Vision screen Hearing Screening - Comments:: Wears hearing aids Vision Screening - Comments:: Gets routine eye exams  Dietary issues and exercise activities discussed:     Goals Addressed               This Visit's Progress     Patient Stated (pt-stated)        Drink more water      Depression Screen    05/03/2023   10:49 AM 03/29/2023   11:43 AM 08/11/2022    8:21 AM 04/10/2022    8:04 AM 02/24/2022   10:18 AM 02/23/2021   11:32 AM 02/23/2020   11:13 AM  PHQ 2/9 Scores  PHQ - 2 Score 0 0 0 0 0 0 0  PHQ- 9 Score 0 1          Fall Risk    05/03/2023   10:51 AM 03/29/2023   11:43 AM 08/11/2022    8:20 AM 04/10/2022    8:04 AM 02/24/2022   10:18 AM  Fall Risk   Falls in the past year? 1 1 0 0 0  Number falls in past yr: 0 0 0 0 0  Injury with Fall? 0 0 0 0   Risk for fall due to : History of fall(s) No Fall Risks No Fall Risks No Fall Risks   Follow up   Falls evaluation completed Falls evaluation completed Falls evaluation completed    MEDICARE RISK AT HOME: Medicare Risk at Home Any stairs in or around the home?: Yes If so, are there any without handrails?: No Home free of loose throw rugs in walkways, pet beds, electrical cords, etc?: Yes Adequate lighting in your home to reduce risk of falls?: Yes Life alert?: No Use of a cane, walker or w/c?: No Grab bars in the bathroom?: No Shower chair or bench in shower?: No Elevated toilet seat or a handicapped toilet?: No  TIMED UP AND GO:  Was the test performed?  No    Cognitive Function:    11/30/2015    3:55 PM  MMSE - Mini Mental State Exam  Orientation to time 5  Orientation to Place 5  Registration 3  Attention/ Calculation 5  Recall 3  Language- name 2 objects 2  Language- repeat 1  Language- follow 3 step command 3  Language- read & follow direction 1  Write a sentence 1  Copy design 1  Total score 30        05/03/2023   10:54 AM 02/23/2021    1:16 PM 02/23/2020   11:24 AM 02/20/2019   11:52 AM 12/03/2017    2:04 PM  6CIT Screen  What Year? 0 points 0 points 0 points 0 points 0 points  What month? 0 points 0 points 0 points 0 points 0 points  What time? 0 points 0 points  0 points 0 points  Count back from 20 0 points 0 points  0 points 0 points  Months in reverse 0 points 0 points 0 points 0 points 0 points  Repeat phrase 0 points      Total Score 0 points        Immunizations Immunization History  Administered Date(s) Administered   Fluad Quad(high Dose 65+) 06/10/2019, 07/14/2020   Influenza Split 06/13/2012,  07/04/2012   Influenza, High Dose Seasonal PF 08/21/2016, 07/03/2017, 07/19/2018   Influenza,inj,Quad PF,6+ Mos 06/25/2013, 06/08/2014, 06/02/2015   Influenza-Unspecified 07/04/2012, 06/25/2013, 06/08/2014, 08/21/2016, 07/03/2017, 07/03/2017, 07/19/2018   PFIZER(Purple Top)SARS-COV-2 Vaccination 09/13/2019, 10/24/2019, 06/23/2020   Pneumococcal Conjugate-13 12/25/2016   Pneumococcal Polysaccharide-23 01/01/2018   Respiratory Syncytial Virus Vaccine,Recomb Aduvanted(Arexvy) 06/15/2022    TDAP status: Due, Education has been provided regarding the importance of this vaccine. Advised may receive this vaccine at local pharmacy or Health Dept. Aware to provide a copy of the vaccination record if obtained from local pharmacy or Health Dept. Verbalized acceptance and understanding.  Flu Vaccine status: Due, Education has been provided regarding the importance of this vaccine. Advised may receive this vaccine at local pharmacy or Health Dept. Aware to provide a copy of the vaccination record if obtained from local pharmacy or Health Dept. Verbalized acceptance and understanding.  Pneumococcal vaccine status: Up to date  Covid-19 vaccine status: Declined, Education has been provided regarding the importance of this vaccine but patient still declined. Advised may receive this vaccine at local pharmacy or Health Dept.or vaccine clinic. Aware to provide a copy of the vaccination record if obtained from local pharmacy or Health Dept. Verbalized acceptance and understanding.  Qualifies for Shingles Vaccine? Yes   Zostavax completed Yes  per patient at [facility] Shingrix Completed?: No.    Education has been provided regarding the importance of this vaccine. Patient has been advised to call insurance company to determine out of pocket expense if they have not yet received this vaccine. Advised may also receive vaccine at local pharmacy or Health Dept. Verbalized acceptance and understanding.  Screening Tests Health  Maintenance  Topic Date Due   DTaP/Tdap/Td (1 - Tdap) Never done   Zoster Vaccines- Shingrix (1 of 2) Never done   DEXA SCAN  Never done   COVID-19 Vaccine (4 - 2023-24 season) 05/12/2022   FOOT EXAM  04/11/2023   INFLUENZA VACCINE  04/12/2023   MAMMOGRAM  12/25/2036 (Originally 07/03/2015)   HEMOGLOBIN A1C  10/06/2023   OPHTHALMOLOGY EXAM  02/15/2024   Medicare Annual Wellness (AWV)  05/02/2024   Pneumonia Vaccine 57+ Years old  Completed   HPV VACCINES  Aged Out    Health Maintenance  Health Maintenance Due  Topic Date Due   DTaP/Tdap/Td (1 - Tdap) Never done   Zoster Vaccines- Shingrix (1 of 2) Never done   DEXA SCAN  Never done   COVID-19 Vaccine (4 - 2023-24 season) 05/12/2022   FOOT EXAM  04/11/2023   INFLUENZA VACCINE  04/12/2023    Colorectal cancer screening: No longer required.   Mammogram status: No longer required due to age.  Bone Density status: Ordered 05/03/23. Pt provided with contact info and advised to call to schedule appt.  Lung Cancer Screening: (Low Dose CT Chest recommended if Age 32-80 years, 20 pack-year currently smoking OR have  quit w/in 15years.) does not qualify.   Lung Cancer Screening Referral: n/a  Additional Screening:  Hepatitis C Screening: does not qualify.  Vision Screening: Recommended annual ophthalmology exams for early detection of glaucoma and other disorders of the eye. Is the patient up to date with their annual eye exam?  Yes  Who is the provider or what is the name of the office in which the patient attends annual eye exams? Dr Ennis Forts, [facility] If pt is not established with a provider, would they like to be referred to a provider to establish care? No .   Dental Screening: Recommended annual dental exams for proper oral hygiene  Diabetic Foot Exam: Diabetic Foot Exam: Overdue, Pt has been advised about the importance in completing this exam. Pt is scheduled for diabetic foot exam on 07/31/23.  Community  Resource Referral / Chronic Care Management: CRR required this visit?  No   CCM required this visit?  No     Plan:     I have personally reviewed and noted the following in the patient's chart:   Medical and social history Use of alcohol, tobacco or illicit drugs  Current medications and supplements including opioid prescriptions. Patient is not currently taking opioid prescriptions. Functional ability and status Nutritional status Physical activity Advanced directives List of other physicians Hospitalizations, surgeries, and ER visits in previous 12 months Vitals Screenings to include cognitive, depression, and falls Referrals and appointments  In addition, I have reviewed and discussed with patient certain preventive protocols, quality metrics, and best practice recommendations. A written personalized care plan for preventive services as well as general preventive health recommendations were provided to patient.     Tora Kindred, CMA   05/03/2023   After Visit Summary: (MyChart) Due to this being a telephonic visit, the after visit summary with patients personalized plan was offered to patient via MyChart   Nurse Notes:  Will get flu shot this fall. Declines covid booster. Needs tetanus vaccine. Declined DM & Nutrition Education. Placed order for DEXA scan. Needs DM foot exam at next OV 07/31/23.    I have reviewed the above information and agree with above.   Duncan Dull, MD

## 2023-05-07 ENCOUNTER — Telehealth: Payer: Self-pay

## 2023-05-07 NOTE — Telephone Encounter (Signed)
Patient states she would like to leave a message for Gunnar Fusi, who she just spoke with for her AWV.  Patient states Gunnar Fusi asked her to make an appointment for a bone density scan.  Patient states when she tried to make the appointment, she was told that the order had Dr. Brennan Bailey name on it instead of Dr. Dale Kunkle.  Patient states she spoke with [facility] and they state the name on the order needs to be corrected to Dr. Dale Thunderbolt for the order.  Patient states Tora Kindred, CMA, may call her if she has any questions.

## 2023-05-08 NOTE — Addendum Note (Signed)
Addended by: Tora Kindred on: 05/08/2023 12:03 PM   Modules accepted: Orders

## 2023-05-08 NOTE — Telephone Encounter (Signed)
New order placed.  Patient aware.

## 2023-05-12 ENCOUNTER — Other Ambulatory Visit: Payer: Self-pay | Admitting: Internal Medicine

## 2023-06-07 ENCOUNTER — Telehealth: Payer: Self-pay | Admitting: *Deleted

## 2023-06-07 ENCOUNTER — Ambulatory Visit
Admission: RE | Admit: 2023-06-07 | Discharge: 2023-06-07 | Disposition: A | Discharge status: Home / Self Care | Payer: Medicare Other | Source: Ambulatory Visit | Attending: Internal Medicine | Admitting: Internal Medicine

## 2023-06-07 DIAGNOSIS — Z780 Asymptomatic menopausal state: Secondary | ICD-10-CM | POA: Diagnosis not present

## 2023-06-07 NOTE — Telephone Encounter (Signed)
-----   Message from [place] sent at 06/07/2023  9:13 AM EDT ----- Please call and notify Tina Watson that her bone density is normal.

## 2023-06-07 NOTE — Telephone Encounter (Signed)
Patient just returned call. I read message to her.

## 2023-06-07 NOTE — Telephone Encounter (Signed)
Left voicemail to return call or see mychart message

## 2023-06-07 NOTE — Telephone Encounter (Signed)
NOTED

## 2023-07-23 DIAGNOSIS — L578 Other skin changes due to chronic exposure to nonionizing radiation: Secondary | ICD-10-CM | POA: Diagnosis not present

## 2023-07-23 DIAGNOSIS — Z859 Personal history of malignant neoplasm, unspecified: Secondary | ICD-10-CM | POA: Diagnosis not present

## 2023-07-23 DIAGNOSIS — Z85828 Personal history of other malignant neoplasm of skin: Secondary | ICD-10-CM | POA: Diagnosis not present

## 2023-07-23 DIAGNOSIS — Z872 Personal history of diseases of the skin and subcutaneous tissue: Secondary | ICD-10-CM | POA: Diagnosis not present

## 2023-07-23 DIAGNOSIS — L821 Other seborrheic keratosis: Secondary | ICD-10-CM | POA: Diagnosis not present

## 2023-07-23 DIAGNOSIS — L570 Actinic keratosis: Secondary | ICD-10-CM | POA: Diagnosis not present

## 2023-07-31 ENCOUNTER — Ambulatory Visit: Payer: Medicare Other | Admitting: Internal Medicine

## 2023-07-31 VITALS — BP 122/70 | HR 90 | Temp 98.3°F | Resp 16 | Ht 63.0 in | Wt 154.0 lb

## 2023-07-31 DIAGNOSIS — G720 Drug-induced myopathy: Secondary | ICD-10-CM | POA: Diagnosis not present

## 2023-07-31 DIAGNOSIS — I10 Essential (primary) hypertension: Secondary | ICD-10-CM

## 2023-07-31 DIAGNOSIS — T466X5A Adverse effect of antihyperlipidemic and antiarteriosclerotic drugs, initial encounter: Secondary | ICD-10-CM

## 2023-07-31 DIAGNOSIS — E1165 Type 2 diabetes mellitus with hyperglycemia: Secondary | ICD-10-CM

## 2023-07-31 DIAGNOSIS — E7800 Pure hypercholesterolemia, unspecified: Secondary | ICD-10-CM | POA: Diagnosis not present

## 2023-07-31 DIAGNOSIS — Z23 Encounter for immunization: Secondary | ICD-10-CM | POA: Diagnosis not present

## 2023-07-31 NOTE — Progress Notes (Unsigned)
Subjective:    Patient ID: Tina Watson, female    DOB: 10-01-1927, 89 y.o.   MRN: [id number]  Patient here for  Chief Complaint  Patient presents with   Medical Management of Chronic Issues    HPI Here to follow up regarding hypercholesterolemia, diabetes and hypertension. Reports she is doing relatively well. Stays active. No chest pain or sob reported. States am sugars averaging 135.  Bowels stable.    Past Medical History:  Diagnosis Date   Anemia    Cancer (HCC)    Skin   Chicken pox    Colon polyps    Diabetes mellitus without complication (HCC)    Diverticulosis    Granuloma annulare    Hypercholesterolemia    Hypertension    Osteoporosis    Past Surgical History:  Procedure Laterality Date   MOHS SURGERY  07/2014   BCCA on nose   TONSILLECTOMY  1952   vaginal hysterectomy with anterior repair  1990   Family History  Problem Relation Age of Onset   Cancer Mother        question of rectum/vaginal   Stroke Father    Kidney failure Father    Stroke Sister    Dementia Sister    Pneumonia Sister    Heart disease Brother        x4   Breast cancer Neg Hx    Colon cancer Neg Hx    Social History   Socioeconomic History   Marital status: Widowed    Spouse name: Not on file   Number of children: 2   Years of education: Not on file   Highest education level: Not on file  Occupational History   Not on file  Tobacco Use   Smoking status: Never   Smokeless tobacco: Never  Substance and Sexual Activity   Alcohol use: Yes    Comment: rare twice a year   Drug use: No   Sexual activity: Never  Other Topics Concern   Not on file  Social History Narrative   Widowed, 2 children, 1 daughter who lives with her   Social Determinants of Health   Financial Resource Strain: Low Risk  (05/03/2023)   Overall Financial Resource Strain (CARDIA)    Difficulty of Paying Living Expenses: Not hard at all  Food Insecurity: No Food Insecurity (05/03/2023)   Hunger Vital  Sign    Worried About Running Out of Food in the Last Year: Never true    Ran Out of Food in the Last Year: Never true  Transportation Needs: No Transportation Needs (05/03/2023)   PRAPARE - Administrator, Civil Service (Medical): No    Lack of Transportation (Non-Medical): No  Physical Activity: Sufficiently Active (05/03/2023)   Exercise Vital Sign    Days of Exercise per Week: 7 days    Minutes of Exercise per Session: 30 min  Stress: No Stress Concern Present (05/03/2023)   [employer] of Occupational Health - Occupational Stress Questionnaire    Feeling of Stress : Only a little  Social Connections: Moderately Integrated (05/03/2023)   Social Connection and Isolation Panel [NHANES]    Frequency of Communication with Friends and Family: More than three times a week    Frequency of Social Gatherings with Friends and Family: Twice a week    Attends Religious Services: More than 4 times per year    Active Member of [employer] or Organizations: Yes    Attends Banker Meetings: More than  4 times per year    Marital Status: Widowed     Review of Systems  Constitutional:  Negative for appetite change and unexpected weight change.  HENT:  Negative for congestion and sinus pressure.   Respiratory:  Negative for cough, chest tightness and shortness of breath.   Cardiovascular:  Negative for chest pain and palpitations.  Gastrointestinal:  Negative for abdominal pain, diarrhea, nausea and vomiting.  Genitourinary:  Negative for difficulty urinating and dysuria.  Musculoskeletal:  Negative for joint swelling and myalgias.  Skin:  Negative for color change and rash.  Neurological:  Negative for dizziness and headaches.  Psychiatric/Behavioral:  Negative for agitation and dysphoric mood.        Objective:     BP 122/70   Pulse 90   Temp 98.3 F (36.8 C)   Resp 16   Ht 5\' 3"  (1.6 m)   Wt 154 lb (69.9 kg)   LMP 09/13/1984   SpO2 98%   BMI 27.28 kg/m  Wt  Readings from Last 3 Encounters:  07/31/23 154 lb (69.9 kg)  05/03/23 154 lb (69.9 kg)  03/29/23 154 lb (69.9 kg)    Physical Exam Vitals reviewed.  Constitutional:      General: She is not in acute distress.    Appearance: Normal appearance.  HENT:     Head: Normocephalic and atraumatic.     Right Ear: External ear normal.     Left Ear: External ear normal.  Eyes:     General: No scleral icterus.       Right eye: No discharge.        Left eye: No discharge.     Conjunctiva/sclera: Conjunctivae normal.  Neck:     Thyroid: No thyromegaly.  Cardiovascular:     Rate and Rhythm: Normal rate and regular rhythm.  Pulmonary:     Effort: No respiratory distress.     Breath sounds: Normal breath sounds. No wheezing.  Abdominal:     General: Bowel sounds are normal.     Palpations: Abdomen is soft.     Tenderness: There is no abdominal tenderness.  Musculoskeletal:        General: No swelling or tenderness.     Cervical back: Neck supple. No tenderness.  Lymphadenopathy:     Cervical: No cervical adenopathy.  Skin:    Findings: No erythema or rash.  Neurological:     Mental Status: She is alert.  Psychiatric:        Mood and Affect: Mood normal.        Behavior: Behavior normal.      Outpatient Encounter Medications as of 07/31/2023  Medication Sig   Calcium Carbonate-Vitamin D 600-400 MG-UNIT tablet Take 1 tablet by mouth daily.   ferrous sulfate 325 (65 FE) MG tablet TAKE 1 TABLET BY MOUTH DAILY   fish oil-omega-3 fatty acids 1000 MG capsule Take 1 g by mouth daily.   glucose blood (BAYER CONTOUR NEXT TEST) test strip TEST ONCE DAILY   latanoprost (XALATAN) 0.005 % ophthalmic solution Place 1 drop into both eyes at bedtime.   mometasone (ELOCON) 0.1 % cream 2 (two) times daily.   mometasone (ELOCON) 0.1 % lotion SMARTSIG:In Ear(s)   Multiple Vitamin (MULTIVITAMIN) tablet Take 1 tablet by mouth daily.   telmisartan (MICARDIS) 40 MG tablet TAKE 1 TABLET BY MOUTH EVERY  DAY   triamterene-hydrochlorothiazide (MAXZIDE-25) 37.5-25 MG tablet TAKE 1/2 TABLET BY MOUTH DAILY   No facility-administered encounter medications on file as of 07/31/2023.  Lab Results  Component Value Date   WBC 9.5 07/31/2023   HGB 12.7 07/31/2023   HCT 37.7 07/31/2023   PLT 246.0 07/31/2023   GLUCOSE 106 (H) 07/31/2023   CHOL 348 (H) 07/31/2023   TRIG (H) 07/31/2023    435.0 Triglyceride is over 400; calculations on Lipids are invalid.   HDL 55.50 07/31/2023   LDLDIRECT 201.0 07/31/2023   ALT 16 07/31/2023   AST 17 07/31/2023   NA 139 07/31/2023   K 4.2 07/31/2023   CL 98 07/31/2023   CREATININE 1.15 07/31/2023   BUN 34 (H) 07/31/2023   CO2 31 07/31/2023   TSH 2.91 04/05/2023   HGBA1C 7.4 (H) 07/31/2023   MICROALBUR <0.7 08/01/2023    DG Bone Density  Result Date: 06/07/2023 EXAM: DUAL X-RAY ABSORPTIOMETRY (DXA) FOR BONE MINERAL DENSITY IMPRESSION: Your patient Madyson Hamlette completed a BMD test on 06/07/2023 using the [employer] DXA System (software version: 14.10) manufactured by [employer]. The following summarizes the results of our evaluation. Technologist: SCE PATIENT BIOGRAPHICAL: Name: Lauralei, Troughton Patient ID: [id number] Birth Date: 03-25-1928 Height: 62.5 in. Gender: Female Exam Date: 06/07/2023 Weight: 152.0 lbs. Indications: Advanced Age, Caucasian, Height Loss, Hysterectomy, Parent Hip Fracture, Postmenopausal, Pre-Diabetic Fractures: Treatments: calcium w/ vit D, Multi-Vitamin DENSITOMETRY RESULTS: Site      Region        Measured Date Measured Age WHO Classification Young Adult T-score BMD         %Change vs. Previous Significant Change (*) AP Spine L1-L4 (L2,L3) 06/07/2023 93.7 Normal 1.6 1.377 g/cm2 - - DualFemur Neck Right 06/07/2023 93.7 Normal -0.7 0.942 g/cm2 - - ASSESSMENT: The BMD measured at Femur Neck Right is 0.942 g/cm2 with a T-score of -0.7. This patient is considered normal according to World Health Organization [facility]) criteria. The scan  quality is good. L-3 and L-4 was excluded due to degenerative changes. World Science writer [facility]) criteria for post-menopausal, Caucasian Women: Normal:                   T-score at or above -1 SD Osteopenia/low bone mass: T-score between -1 and -2.5 SD Osteoporosis:             T-score at or below -2.5 SD RECOMMENDATIONS: 1. All patients should optimize calcium and vitamin D intake. 2. Consider FDA-approved medical therapies in postmenopausal women and men aged 77 years and older, based on the following: a. A hip or vertebral(clinical or morphometric) fracture b. T-score < -2.5 at the femoral neck or spine after appropriate evaluation to exclude secondary causes c. Low bone mass (T-score between -1.0 and -2.5 at the femoral neck or spine) and a 10-year probability of a hip fracture > 3% or a 10-year probability of a major osteoporosis-related fracture > 20% based on the [place]-adapted WHO algorithm 3. Clinician judgment and/or patient preferences may indicate treatment for people with 10-year fracture probabilities above or below these levels FOLLOW-UP: People with diagnosed cases of osteoporosis or at high risk for fracture should have regular bone mineral density tests. For patients eligible for Medicare, routine testing is allowed once every 2 years. The testing frequency can be increased to one year for patients who have rapidly progressing disease, those who are receiving or discontinuing medical therapy to restore bone mass, or have additional risk factors. I have reviewed this report, and agree with the above findings. [facility] Electronically Signed   By: Baird Lyons M.D.   On: 06/07/2023 09:04  Assessment & Plan:  Primary hypertension Assessment & Plan: Continue triam/hctz and micardis.  Follow pressures.  Check metabolic panel. Blood pressure as outlined.  Hold on making changes.   Orders: -     Basic metabolic panel  Hypercholesterolemia Assessment & Plan: Intolerance  to multiple statins. Off all cholesterol medication now. Desires not to try another medication.  Follow cholesterol.  Low cholesterol diet and exercise.  Follow lipid panel.    Orders: -     Hepatic function panel -     CBC with Differential/Platelet -     Lipid panel  Type 2 diabetes mellitus with hyperglycemia, without long-term current use of insulin (HCC) Assessment & Plan: Sugars as outlined.  Low carb diet and exercise.  Follow met b and a1c.  Continue to follow sugars.   Orders: -     Hemoglobin A1c -     Microalbumin / creatinine urine ratio; Future  Need for influenza vaccination -     Flu Vaccine Trivalent High Dose (Fluad)  Statin myopathy Assessment & Plan: Intolerance to statins.     Other orders -     LDL cholesterol, direct     Dale Wasola, MD

## 2023-08-01 ENCOUNTER — Encounter: Payer: Self-pay | Admitting: Internal Medicine

## 2023-08-01 LAB — HEPATIC FUNCTION PANEL
ALT: 16 U/L (ref 0–35)
AST: 17 U/L (ref 0–37)
Albumin: 4.5 g/dL (ref 3.5–5.2)
Alkaline Phosphatase: 83 U/L (ref 39–117)
Bilirubin, Direct: 0.1 mg/dL (ref 0.0–0.3)
Total Bilirubin: 0.6 mg/dL (ref 0.2–1.2)
Total Protein: 7.1 g/dL (ref 6.0–8.3)

## 2023-08-01 LAB — BASIC METABOLIC PANEL
BUN: 34 mg/dL — ABNORMAL HIGH (ref 6–23)
CO2: 31 meq/L (ref 19–32)
Calcium: 10 mg/dL (ref 8.4–10.5)
Chloride: 98 meq/L (ref 96–112)
Creatinine, Ser: 1.15 mg/dL (ref 0.40–1.20)
GFR: 40.96 mL/min — ABNORMAL LOW (ref >60.00)
Glucose, Bld: 106 mg/dL — ABNORMAL HIGH (ref 70–99)
Potassium: 4.2 meq/L (ref 3.5–5.1)
Sodium: 139 meq/L (ref 135–145)

## 2023-08-01 LAB — MICROALBUMIN / CREATININE URINE RATIO
Creatinine,U: 67 mg/dL
Microalb Creat Ratio: 1 mg/g (ref 0.0–30.0)
Microalb, Ur: <0.7 mg/dL (ref 0.0–1.9)

## 2023-08-01 LAB — CBC WITH DIFFERENTIAL/PLATELET
Basophils Absolute: 0.1 10*3/uL (ref 0.0–0.1)
Basophils Relative: 0.7 % (ref 0.0–3.0)
Eosinophils Absolute: 0.3 10*3/uL (ref 0.0–0.7)
Eosinophils Relative: 2.9 % (ref 0.0–5.0)
HCT: 37.7 % (ref 36.0–46.0)
Hemoglobin: 12.7 g/dL (ref 12.0–15.0)
Lymphocytes Relative: 38 % (ref 12.0–46.0)
Lymphs Abs: 3.6 10*3/uL (ref 0.7–4.0)
MCHC: 33.7 g/dL (ref 30.0–36.0)
MCV: 88.8 fL (ref 78.0–100.0)
Monocytes Absolute: 0.8 10*3/uL (ref 0.1–1.0)
Monocytes Relative: 8.4 % (ref 3.0–12.0)
Neutro Abs: 4.7 10*3/uL (ref 1.4–7.7)
Neutrophils Relative %: 50 % (ref 43.0–77.0)
Platelets: 246 10*3/uL (ref 150.0–400.0)
RBC: 4.25 Mil/uL (ref 3.87–5.11)
RDW: 13.8 % (ref 11.5–15.5)
WBC: 9.5 10*3/uL (ref 4.0–10.5)

## 2023-08-01 LAB — LDL CHOLESTEROL, DIRECT: Direct LDL: 201 mg/dL

## 2023-08-01 LAB — LIPID PANEL
Cholesterol: 348 mg/dL — ABNORMAL HIGH (ref 0–200)
HDL: 55.5 mg/dL (ref >39.00)
Total CHOL/HDL Ratio: 6
Triglycerides: 435 mg/dL — ABNORMAL HIGH (ref 0.0–149.0)

## 2023-08-01 LAB — HEMOGLOBIN A1C: Hgb A1c MFr Bld: 7.4 % — ABNORMAL HIGH (ref 4.6–6.5)

## 2023-08-01 NOTE — Assessment & Plan Note (Signed)
Continue triam/hctz and micardis.  Follow pressures.  Check metabolic panel. Blood pressure as outlined.  Hold on making changes.  

## 2023-08-01 NOTE — Assessment & Plan Note (Signed)
Sugars as outlined.  Low carb diet and exercise.  Follow met b and a1c.  Continue to follow sugars.

## 2023-08-01 NOTE — Assessment & Plan Note (Signed)
Intolerance to multiple statins. Off all cholesterol medication now. Desires not to try another medication.  Follow cholesterol.  Low cholesterol diet and exercise.  Follow lipid panel.

## 2023-08-01 NOTE — Assessment & Plan Note (Signed)
Intolerance to statins.

## 2023-08-02 ENCOUNTER — Telehealth: Payer: Self-pay

## 2023-08-02 NOTE — Telephone Encounter (Signed)
-----   Message from [place] sent at 08/01/2023  9:03 PM EST ----- Notify - overall sugar control increased some from last check, but overall relatively stable. Triglycerides remain elevated.  Bad cholesterol elevated.  She has had problems taking statin medication.  If agreeable, can try other cholesterol medications - zetia or an injectable cholesterol medication - repatha.  If agreeable to start one of these let me know.  If not, continue low carb diet and exercise. Kidney function relatively stable. Stay hydrated. Hgb and liver function tests are wnl.

## 2023-08-02 NOTE — Telephone Encounter (Signed)
Left message to call the office back regarding lab results below. 

## 2023-08-03 MED ORDER — EZETIMIBE 10 MG PO TABS: 10.0000 mg | ORAL_TABLET | Freq: Every day | ORAL | 0 refills | Status: DC

## 2023-08-03 NOTE — Telephone Encounter (Signed)
Patient called and note from Dr Lorin Picket was read. She would like to try zetia. She would like a small amount of pills to try, if possible a 15 day. Her Pharmacy is [facility] in [place].

## 2023-08-03 NOTE — Telephone Encounter (Signed)
Noted  

## 2023-08-03 NOTE — Telephone Encounter (Signed)
Zetia sent to pharmacy.

## 2023-08-03 NOTE — Addendum Note (Signed)
Addended by: Rita Ohara D on: 08/03/2023 09:35 AM   Modules accepted: Orders

## 2023-08-16 DIAGNOSIS — H2513 Age-related nuclear cataract, bilateral: Secondary | ICD-10-CM | POA: Diagnosis not present

## 2023-08-16 DIAGNOSIS — H401131 Primary open-angle glaucoma, bilateral, mild stage: Secondary | ICD-10-CM | POA: Diagnosis not present

## 2023-08-16 DIAGNOSIS — E119 Type 2 diabetes mellitus without complications: Secondary | ICD-10-CM | POA: Diagnosis not present

## 2023-08-25 ENCOUNTER — Other Ambulatory Visit: Payer: Self-pay | Admitting: Internal Medicine

## 2023-09-26 DIAGNOSIS — H903 Sensorineural hearing loss, bilateral: Secondary | ICD-10-CM | POA: Diagnosis not present

## 2023-11-11 ENCOUNTER — Other Ambulatory Visit: Payer: Self-pay | Admitting: Internal Medicine

## 2024-01-21 ENCOUNTER — Encounter: Payer: Self-pay | Admitting: Internal Medicine

## 2024-01-21 ENCOUNTER — Ambulatory Visit (INDEPENDENT_AMBULATORY_CARE_PROVIDER_SITE_OTHER): Admitting: Internal Medicine

## 2024-01-21 VITALS — BP 128/72 | HR 80 | Temp 98.0°F | Resp 16 | Ht 63.0 in | Wt 152.4 lb

## 2024-01-21 DIAGNOSIS — E7800 Pure hypercholesterolemia, unspecified: Secondary | ICD-10-CM | POA: Diagnosis not present

## 2024-01-21 DIAGNOSIS — I10 Essential (primary) hypertension: Secondary | ICD-10-CM | POA: Diagnosis not present

## 2024-01-21 DIAGNOSIS — G720 Drug-induced myopathy: Secondary | ICD-10-CM | POA: Diagnosis not present

## 2024-01-21 DIAGNOSIS — T466X5A Adverse effect of antihyperlipidemic and antiarteriosclerotic drugs, initial encounter: Secondary | ICD-10-CM | POA: Diagnosis not present

## 2024-01-21 DIAGNOSIS — E1165 Type 2 diabetes mellitus with hyperglycemia: Secondary | ICD-10-CM

## 2024-01-21 LAB — LIPID PANEL
Cholesterol: 336 mg/dL — ABNORMAL HIGH (ref 0–200)
HDL: 62.7 mg/dL (ref >39.00)
LDL Cholesterol: 195 mg/dL — ABNORMAL HIGH (ref 0–99)
NonHDL: 273.77
Total CHOL/HDL Ratio: 5
Triglycerides: 395 mg/dL — ABNORMAL HIGH (ref 0.0–149.0)
VLDL: 79 mg/dL — ABNORMAL HIGH (ref 0.0–40.0)

## 2024-01-21 LAB — HEPATIC FUNCTION PANEL
ALT: 19 U/L (ref 0–35)
AST: 19 U/L (ref 0–37)
Albumin: 4.5 g/dL (ref 3.5–5.2)
Alkaline Phosphatase: 79 U/L (ref 39–117)
Bilirubin, Direct: 0.1 mg/dL (ref 0.0–0.3)
Total Bilirubin: 0.5 mg/dL (ref 0.2–1.2)
Total Protein: 7.7 g/dL (ref 6.0–8.3)

## 2024-01-21 LAB — BASIC METABOLIC PANEL WITH GFR
BUN: 31 mg/dL — ABNORMAL HIGH (ref 6–23)
CO2: 29 meq/L (ref 19–32)
Calcium: 10.1 mg/dL (ref 8.4–10.5)
Chloride: 99 meq/L (ref 96–112)
Creatinine, Ser: 1.04 mg/dL (ref 0.40–1.20)
GFR: 46.06 mL/min — ABNORMAL LOW (ref >60.00)
Glucose, Bld: 134 mg/dL — ABNORMAL HIGH (ref 70–99)
Potassium: 4 meq/L (ref 3.5–5.1)
Sodium: 139 meq/L (ref 135–145)

## 2024-01-21 LAB — TSH: TSH: 2.69 u[IU]/mL (ref 0.35–5.50)

## 2024-01-21 LAB — HEMOGLOBIN A1C: Hgb A1c MFr Bld: 7.5 % — ABNORMAL HIGH (ref 4.6–6.5)

## 2024-01-21 MED ORDER — TELMISARTAN 40 MG PO TABS
40.0000 mg | ORAL_TABLET | Freq: Every day | ORAL | 3 refills | Status: AC
Start: 2024-01-21 — End: ?

## 2024-01-21 MED ORDER — TRIAMTERENE-HCTZ 37.5-25 MG PO TABS: 0.5000 | ORAL_TABLET | Freq: Every day | ORAL | 1 refills | Status: DC

## 2024-01-21 NOTE — Assessment & Plan Note (Signed)
 Sugars as outlined. Continue low carb diet and exercise. Follow met b and A1c.

## 2024-01-21 NOTE — Progress Notes (Signed)
 Subjective:    Patient ID: Tina Watson, female    DOB: August 31, 1928, 89 y.o.   MRN: [id number]  Patient here for  Chief Complaint  Patient presents with   Medical Management of Chronic Issues    HPI Here to follow up regarding hypercholesterolemia, diabetes and hypertension. Continues on micardis  and triam/hydrochlorothiazide. She reports she is doing relatively well. Stays active. No chest pain. Breathing stable. No bowel change reported. Reports am sugars vary - range lowest 106 and highest 150. Most averaging 120-130s. She has been planting flowers.    Past Medical History:  Diagnosis Date   Anemia    Cancer (HCC)    Skin   Chicken pox    Colon polyps    Diabetes mellitus without complication (HCC)    Diverticulosis    Granuloma annulare    Hypercholesterolemia    Hypertension    Osteoporosis    Past Surgical History:  Procedure Laterality Date   MOHS SURGERY  07/2014   BCCA on nose   TONSILLECTOMY  1952   vaginal hysterectomy with anterior repair  1990   Family History  Problem Relation Age of Onset   Cancer Mother        question of rectum/vaginal   Stroke Father    Kidney failure Father    Stroke Sister    Dementia Sister    Pneumonia Sister    Heart disease Brother        x4   Breast cancer Neg Hx    Colon cancer Neg Hx    Social History   Socioeconomic History   Marital status: Widowed    Spouse name: Not on file   Number of children: 2   Years of education: Not on file   Highest education level: Not on file  Occupational History   Not on file  Tobacco Use   Smoking status: Never   Smokeless tobacco: Never  Substance and Sexual Activity   Alcohol use: Yes    Comment: rare twice a year   Drug use: No   Sexual activity: Never  Other Topics Concern   Not on file  Social History Narrative   Widowed, 2 children, 1 daughter who lives with her   Social Drivers of Corporate investment banker Strain: Low Risk  (05/03/2023)   Overall Financial  Resource Strain (CARDIA)    Difficulty of Paying Living Expenses: Not hard at all  Food Insecurity: No Food Insecurity (05/03/2023)   Hunger Vital Sign    Worried About Running Out of Food in the Last Year: Never true    Ran Out of Food in the Last Year: Never true  Transportation Needs: No Transportation Needs (05/03/2023)   PRAPARE - Administrator, Civil Service (Medical): No    Lack of Transportation (Non-Medical): No  Physical Activity: Sufficiently Active (05/03/2023)   Exercise Vital Sign    Days of Exercise per Week: 7 days    Minutes of Exercise per Session: 30 min  Stress: No Stress Concern Present (05/03/2023)   [employer] of Occupational Health - Occupational Stress Questionnaire    Feeling of Stress : Only a little  Social Connections: Moderately Integrated (05/03/2023)   Social Connection and Isolation Panel [NHANES]    Frequency of Communication with Friends and Family: More than three times a week    Frequency of Social Gatherings with Friends and Family: Twice a week    Attends Religious Services: More than 4 times per year  Active Member of Clubs or Organizations: Yes    Attends Banker Meetings: More than 4 times per year    Marital Status: Widowed     Review of Systems  Constitutional:  Negative for appetite change and unexpected weight change.  HENT:  Negative for congestion and sinus pressure.   Respiratory:  Negative for cough, chest tightness and shortness of breath.   Cardiovascular:  Negative for chest pain, palpitations and leg swelling.  Gastrointestinal:  Negative for abdominal pain, diarrhea, nausea and vomiting.  Genitourinary:  Negative for difficulty urinating and dysuria.  Musculoskeletal:  Negative for joint swelling and myalgias.  Skin:  Negative for color change and rash.  Neurological:  Negative for dizziness and headaches.  Psychiatric/Behavioral:  Negative for agitation and dysphoric mood.        Objective:      BP 128/72   Pulse 80   Temp 98 F (36.7 C)   Resp 16   Ht 5\' 3"  (1.6 m)   Wt 152 lb 6.4 oz (69.1 kg)   LMP 09/13/1984   SpO2 98%   BMI 27.00 kg/m  Wt Readings from Last 3 Encounters:  01/21/24 152 lb 6.4 oz (69.1 kg)  07/31/23 154 lb (69.9 kg)  05/03/23 154 lb (69.9 kg)    Physical Exam Vitals reviewed.  Constitutional:      General: She is not in acute distress.    Appearance: Normal appearance.  HENT:     Head: Normocephalic and atraumatic.     Right Ear: External ear normal.     Left Ear: External ear normal.     Mouth/Throat:     Pharynx: No oropharyngeal exudate or posterior oropharyngeal erythema.  Eyes:     General: No scleral icterus.       Right eye: No discharge.        Left eye: No discharge.     Conjunctiva/sclera: Conjunctivae normal.  Neck:     Thyroid : No thyromegaly.  Cardiovascular:     Rate and Rhythm: Normal rate and regular rhythm.  Pulmonary:     Effort: No respiratory distress.     Breath sounds: Normal breath sounds. No wheezing.  Abdominal:     General: Bowel sounds are normal.     Palpations: Abdomen is soft.     Tenderness: There is no abdominal tenderness.  Musculoskeletal:        General: No swelling or tenderness.     Cervical back: Neck supple. No tenderness.  Lymphadenopathy:     Cervical: No cervical adenopathy.  Skin:    Findings: No erythema or rash.  Neurological:     Mental Status: She is alert.  Psychiatric:        Mood and Affect: Mood normal.        Behavior: Behavior normal.         Outpatient Encounter Medications as of 01/21/2024  Medication Sig   Calcium Carbonate-Vitamin D  600-400 MG-UNIT tablet Take 1 tablet by mouth daily.   ferrous sulfate  325 (65 FE) MG tablet TAKE 1 TABLET BY MOUTH DAILY   fish oil-omega-3 fatty acids 1000 MG capsule Take 1 g by mouth daily.   glucose blood (BAYER CONTOUR NEXT TEST) test strip TEST ONCE DAILY   latanoprost (XALATAN) 0.005 % ophthalmic solution Place 1 drop into  both eyes at bedtime.   mometasone (ELOCON) 0.1 % cream 2 (two) times daily.   mometasone (ELOCON) 0.1 % lotion SMARTSIG:In Ear(s)   Multiple Vitamin (MULTIVITAMIN) tablet Take  1 tablet by mouth daily.   telmisartan  (MICARDIS ) 40 MG tablet Take 1 tablet (40 mg total) by mouth daily.   triamterene -hydrochlorothiazide (MAXZIDE-25) 37.5-25 MG tablet Take 0.5 tablets by mouth daily.   [DISCONTINUED] ezetimibe  (ZETIA ) 10 MG tablet TAKE 1 TABLET BY MOUTH EVERY DAY   [DISCONTINUED] telmisartan  (MICARDIS ) 40 MG tablet TAKE 1 TABLET BY MOUTH EVERY DAY   [DISCONTINUED] triamterene -hydrochlorothiazide (MAXZIDE-25) 37.5-25 MG tablet TAKE 1/2 TABLET BY MOUTH DAILY   No facility-administered encounter medications on file as of 01/21/2024.     Lab Results  Component Value Date   WBC 9.5 07/31/2023   HGB 12.7 07/31/2023   HCT 37.7 07/31/2023   PLT 246.0 07/31/2023   GLUCOSE 106 (H) 07/31/2023   CHOL 348 (H) 07/31/2023   TRIG (H) 07/31/2023    435.0 Triglyceride is over 400; calculations on Lipids are invalid.   HDL 55.50 07/31/2023   LDLDIRECT 201.0 07/31/2023   ALT 16 07/31/2023   AST 17 07/31/2023   NA 139 07/31/2023   K 4.2 07/31/2023   CL 98 07/31/2023   CREATININE 1.15 07/31/2023   BUN 34 (H) 07/31/2023   CO2 31 07/31/2023   TSH 2.91 04/05/2023   HGBA1C 7.4 (H) 07/31/2023   MICROALBUR <0.7 08/01/2023    DG Bone Density Result Date: 06/07/2023 EXAM: DUAL X-RAY ABSORPTIOMETRY (DXA) FOR BONE MINERAL DENSITY IMPRESSION: Your patient Joliana Ahola completed a BMD test on 06/07/2023 using the [employer] DXA System (software version: 14.10) manufactured by [employer]. The following summarizes the results of our evaluation. Technologist: SCE PATIENT BIOGRAPHICAL: Name: Sahily, Bundy Patient ID: [id number] Birth Date: [birth year] Height: 62.5 in. Gender: Female Exam Date: 06/07/2023 Weight: 152.0 lbs. Indications: Advanced Age, Caucasian, Height Loss, Hysterectomy, Parent Hip Fracture,  Postmenopausal, Pre-Diabetic Fractures: Treatments: calcium w/ vit D, Multi-Vitamin DENSITOMETRY RESULTS: Site      Region        Measured Date Measured Age WHO Classification Young Adult T-score BMD         %Change vs. Previous Significant Change (*) AP Spine L1-L4 (L2,L3) 8/07/2023 93.7 Normal 1.6 1.377 g/cm2 - - DualFemur Neck Right 06[age]07/2023 93.7 Normal -0.7 0.942 g/cm2 - - ASSESSMENT: The BMD measured at Femur Neck Right is 0.942 g/cm2 with a T-score of -0.7. This patient is considered normal according to World Health Organization [facility] criteria. The scan quality is good. L-3 and L-4 was excluded due to degenerative changes. World Science writer [facility]) criteria for post-menopausal, Caucasian Women: Normal:                   T-score at or above -1 SD Osteopenia/low bone mass: T-score between -1 and -2.5 SD Osteoporosis:             T-score at or below -2.5 SD RECOMMENDATIONS: 1. All patients should optimize calcium and vitamin D  intake. 2. Consider FDA-approved medical therapies in postmenopausal women and men aged 74 years and older, based on the following: a. A hip or vertebral(clinical or morphometric) fracture b. T-score < -2.5 at the femoral neck or spine after appropriate evaluation to exclude secondary causes c. Low bone mass (T-score between -1.0 and -2.5 at the femoral neck or spine) and a 10-year probability of a hip fracture > 3% or a 10-year probability of a major osteoporosis-related fracture > 20% based on the [place] -adapted WHO algorithm 3. Clinician judgment and/or patient preferences may indicate treatment for people with 10-year fracture probabilities above or below these levels FOLLOW-UP: People with  diagnosed cases of osteoporosis or at high risk for fracture should have regular bone mineral density tests. For patients eligible for Medicare, routine testing is allowed once every 2 years. The testing frequency can be increased to one year for patients who have rapidly progressing disease,  those who are receiving or discontinuing medical therapy to restore bone mass, or have additional risk factors. I have reviewed this report, and agree with the above findings. [facility] Electronically Signed   By: Dina  Arceo M.D.   On: 06/07/2023 09:04       Assessment & Plan:  Hypercholesterolemia Assessment & Plan: Intolerance to multiple statins. Had intolerance to zetia . Off all cholesterol medication now. Desires not to try another medication.  Follow cholesterol.  Low cholesterol diet and exercise.  Follow lipid panel.  Check lipid panel today.   Orders: -     Hepatic function panel -     TSH -     Lipid panel  Primary hypertension Assessment & Plan: Continue triam/hctz and micardis .  Follow pressures.  Check metabolic panel. Blood pressure as outlined. Hold on making changes. Check metabolic panel today.    Statin myopathy Assessment & Plan: Intolerant to multiple statins.    Type 2 diabetes mellitus with hyperglycemia, without long-term current use of insulin (HCC) Assessment & Plan: Sugars as outlined. Continue low carb diet and exercise. Follow met b and A1c.   Orders: -     Basic metabolic panel with GFR -     Hemoglobin A1c  Other orders -     Telmisartan ; Take 1 tablet (40 mg total) by mouth daily.  Dispense: 90 tablet; Refill: 3 -     Triamterene -HCTZ; Take 0.5 tablets by mouth daily.  Dispense: 45 tablet; Refill: 1     Dellar Fenton, MD

## 2024-01-21 NOTE — Assessment & Plan Note (Signed)
 Intolerance to multiple statins. Had intolerance to zetia . Off all cholesterol medication now. Desires not to try another medication.  Follow cholesterol.  Low cholesterol diet and exercise.  Follow lipid panel.  Check lipid panel today.

## 2024-01-21 NOTE — Assessment & Plan Note (Signed)
 Continue triam/hctz and micardis .  Follow pressures.  Check metabolic panel. Blood pressure as outlined. Hold on making changes. Check metabolic panel today.

## 2024-01-21 NOTE — Assessment & Plan Note (Signed)
Intolerant to multiple statins. 

## 2024-01-22 ENCOUNTER — Ambulatory Visit: Payer: Self-pay

## 2024-01-22 ENCOUNTER — Telehealth: Payer: Self-pay

## 2024-01-22 NOTE — Telephone Encounter (Signed)
 Left message to call the office back regarding the lab results below. Okay to give the lab results below if the Patient calls back. Mailing [facility] Lipid diet to her house.

## 2024-01-22 NOTE — Telephone Encounter (Signed)
 Copied from CRM [id number]. Topic: Clinical - Lab/Test Results >> Jan 22, 2024 10:44 AM Corin V wrote: Reason for CRM: Patient called to get lab results. Read provider note verbatim. Patient verbalized understanding and has no additional questions or concerns at this time.  She cannot get into MyChart and would like the [facility] lipid diet information mailed to her home address on file.

## 2024-01-22 NOTE — Telephone Encounter (Signed)
 See result note.

## 2024-01-22 NOTE — Telephone Encounter (Signed)
-----   Message from Enders sent at 01/22/2024  4:18 AM EDT ----- Please call and notify Tina Watson that her kidney function improved from last check. Still decreased, but improved. Continue to avoid antiinflammatory medication.  Overall sugar control relatively stable. A1c 7.5. continue low carb diet. Cholesterol remains elevated. She has had intolerance to multiple cholesterol medications and declines to take. Recommend the low carb diet as outlined. Send copy of [facility] lipid diet. Thyroid  test and liver function tests are wnl.

## 2024-02-08 DIAGNOSIS — H401131 Primary open-angle glaucoma, bilateral, mild stage: Secondary | ICD-10-CM | POA: Diagnosis not present

## 2024-02-14 DIAGNOSIS — H2513 Age-related nuclear cataract, bilateral: Secondary | ICD-10-CM | POA: Diagnosis not present

## 2024-02-14 DIAGNOSIS — H401131 Primary open-angle glaucoma, bilateral, mild stage: Secondary | ICD-10-CM | POA: Diagnosis not present

## 2024-02-14 DIAGNOSIS — H43813 Vitreous degeneration, bilateral: Secondary | ICD-10-CM | POA: Diagnosis not present

## 2024-02-14 DIAGNOSIS — H04123 Dry eye syndrome of bilateral lacrimal glands: Secondary | ICD-10-CM | POA: Diagnosis not present

## 2024-02-14 DIAGNOSIS — E119 Type 2 diabetes mellitus without complications: Secondary | ICD-10-CM | POA: Diagnosis not present

## 2024-02-14 LAB — HM DIABETES EYE EXAM

## 2024-03-04 DIAGNOSIS — H2511 Age-related nuclear cataract, right eye: Secondary | ICD-10-CM | POA: Diagnosis not present

## 2024-03-04 DIAGNOSIS — H2513 Age-related nuclear cataract, bilateral: Secondary | ICD-10-CM | POA: Diagnosis not present

## 2024-03-19 ENCOUNTER — Encounter: Payer: Self-pay | Admitting: Ophthalmology

## 2024-03-21 NOTE — Discharge Instructions (Signed)

## 2024-03-25 NOTE — Anesthesia Preprocedure Evaluation (Addendum)
 Anesthesia Evaluation  Patient identified by MRN, date of birth, ID band Patient awake    Reviewed: Allergy & Precautions, H&P , NPO status , Patient's Chart, lab work & pertinent test results  Airway        Dental   Pulmonary neg pulmonary ROS          Cardiovascular hypertension, negative cardio ROS      Neuro/Psych  Neuromuscular disease negative neurological ROS  negative psych ROS   GI/Hepatic negative GI ROS, Neg liver ROS,,,  Endo/Other  negative endocrine ROSdiabetes    Renal/GU negative Renal ROS  negative genitourinary   Musculoskeletal negative musculoskeletal ROS (+)    Abdominal   Peds negative pediatric ROS (+)  Hematology negative hematology ROS (+) Blood dyscrasia, anemia   Anesthesia Other Findings Hypertension Hypercholesterolemia Granuloma annulare Osteoporosis Diverticulosis Anemia Diabetes mellitus without complication (HCC) Cancer (HCC) Colon polyps Chicken pox    Reproductive/Obstetrics negative OB ROS                              Anesthesia Physical Anesthesia Plan  ASA: 3  Anesthesia Plan: MAC   Post-op Pain Management:    Induction: Intravenous  PONV Risk Score and Plan:   Airway Management Planned: Natural Airway and Nasal Cannula  Additional Equipment:   Intra-op Plan:   Post-operative Plan:   Informed Consent: I have reviewed the patients History and Physical, chart, labs and discussed the procedure including the risks, benefits and alternatives for the proposed anesthesia with the patient or authorized representative who has indicated his/her understanding and acceptance.     Dental Advisory Given  Plan Discussed with: Anesthesiologist, CRNA and Surgeon  Anesthesia Plan Comments: (Patient consented for risks of anesthesia including but not limited to:  - adverse reactions to medications - damage to eyes, teeth, lips or other oral  mucosa - nerve damage due to positioning  - sore throat or hoarseness - Damage to heart, brain, nerves, lungs, other parts of body or loss of life  Patient voiced understanding and assent.)         Anesthesia Quick Evaluation

## 2024-03-31 ENCOUNTER — Ambulatory Visit
Admission: RE | Admit: 2024-03-31 | Discharge: 2024-03-31 | Disposition: A | Discharge status: Home / Self Care | Attending: Ophthalmology | Admitting: Ophthalmology

## 2024-03-31 ENCOUNTER — Encounter
Admission: RE | Disposition: A | Discharge status: Home / Self Care | Payer: Self-pay | Source: Home / Self Care | Attending: Ophthalmology

## 2024-03-31 ENCOUNTER — Ambulatory Visit: Payer: Self-pay | Admitting: Anesthesiology

## 2024-03-31 ENCOUNTER — Encounter: Payer: Self-pay | Admitting: Ophthalmology

## 2024-03-31 ENCOUNTER — Other Ambulatory Visit: Payer: Self-pay

## 2024-03-31 DIAGNOSIS — I10 Essential (primary) hypertension: Secondary | ICD-10-CM | POA: Insufficient documentation

## 2024-03-31 DIAGNOSIS — E1136 Type 2 diabetes mellitus with diabetic cataract: Secondary | ICD-10-CM | POA: Insufficient documentation

## 2024-03-31 DIAGNOSIS — H25011 Cortical age-related cataract, right eye: Secondary | ICD-10-CM | POA: Diagnosis present

## 2024-03-31 DIAGNOSIS — H2511 Age-related nuclear cataract, right eye: Secondary | ICD-10-CM | POA: Insufficient documentation

## 2024-03-31 HISTORY — PX: CATARACT EXTRACTION W/PHACO: SHX586

## 2024-03-31 SURGERY — PHACOEMULSIFICATION, CATARACT, WITH IOL INSERTION
Anesthesia: Monitor Anesthesia Care | Site: Eye | Laterality: Right

## 2024-03-31 MED ORDER — ARMC OPHTHALMIC DILATING DROPS
OPHTHALMIC | Status: AC
  Filled 2024-03-31: qty 0.5

## 2024-03-31 MED ORDER — ARMC OPHTHALMIC DILATING DROPS
1.0000 | OPHTHALMIC | Status: DC | PRN
  Administered 2024-03-31 (×3): 1 via OPHTHALMIC

## 2024-03-31 MED ORDER — FENTANYL CITRATE (PF) 100 MCG/2ML IJ SOLN
INTRAMUSCULAR | Status: AC
Start: 2024-03-31 — End: 2024-03-31
  Filled 2024-03-31: qty 2

## 2024-03-31 MED ORDER — SIGHTPATH DOSE#1 NA HYALUR & NA CHOND-NA HYALUR IO KIT
PACK | INTRAOCULAR | Status: DC | PRN
  Administered 2024-03-31: 1 via OPHTHALMIC

## 2024-03-31 MED ORDER — TETRACAINE HCL 0.5 % OP SOLN
OPHTHALMIC | Status: AC
  Filled 2024-03-31: qty 4

## 2024-03-31 MED ORDER — SIGHTPATH DOSE#1 BSS IO SOLN
INTRAOCULAR | Status: DC | PRN
  Administered 2024-03-31: 142 mL via OPHTHALMIC

## 2024-03-31 MED ORDER — MOXIFLOXACIN HCL 0.5 % OP SOLN
OPHTHALMIC | Status: DC | PRN
Start: 2024-03-31 — End: 2024-03-31
  Administered 2024-03-31: .2 mL via OPHTHALMIC

## 2024-03-31 MED ORDER — TETRACAINE HCL 0.5 % OP SOLN
1.0000 [drp] | OPHTHALMIC | Status: DC | PRN
  Administered 2024-03-31 (×3): 1 [drp] via OPHTHALMIC

## 2024-03-31 MED ORDER — SIGHTPATH DOSE#1 BSS IO SOLN
INTRAOCULAR | Status: DC | PRN
  Administered 2024-03-31: 15 mL via INTRAOCULAR

## 2024-03-31 MED ORDER — LACTATED RINGERS IV SOLN: INTRAVENOUS | Status: DC

## 2024-03-31 MED ORDER — LIDOCAINE HCL (PF) 2 % IJ SOLN
INTRAMUSCULAR | Status: DC | PRN
  Administered 2024-03-31: 4 mL via INTRAOCULAR

## 2024-03-31 MED ORDER — MIDAZOLAM HCL 2 MG/2ML IJ SOLN
INTRAMUSCULAR | Status: AC
Start: 2024-03-31 — End: 2024-03-31
  Filled 2024-03-31: qty 2

## 2024-03-31 SURGICAL SUPPLY — 10 items
CATARACT SUITE SIGHTPATH (MISCELLANEOUS) ×1 IMPLANT
DISSECTOR HYDRO NUCLEUS 50X22 (MISCELLANEOUS) ×1 IMPLANT
FEE CATARACT SUITE SIGHTPATH (MISCELLANEOUS) ×1 IMPLANT
GLOVE PI ULTRA LF STRL 7.5 (GLOVE) ×1 IMPLANT
GLOVE SURG SYN 6.5 PF PI BL (GLOVE) ×1 IMPLANT
GLOVE SURG SYN 8.5 PF PI BL (GLOVE) ×1 IMPLANT
LENS IOL TECNIS EYHANCE 26.5 (Intraocular Lens) IMPLANT
NDL FILTER BLUNT 18X1 1/2 (NEEDLE) ×1 IMPLANT
NEEDLE FILTER BLUNT 18X1 1/2 (NEEDLE) ×1 IMPLANT
SYR 3ML LL SCALE MARK (SYRINGE) ×1 IMPLANT

## 2024-03-31 NOTE — OR Nursing (Signed)
 Patient entered the OR at 7:35am and was promptly connected to BP, EKG an O2 sensor.  07:40 167/69 97 HR 67 07:45 164/64 98 HR 67 07:50 157/68 95 HR 73 07:55 152/67 95 HR 74 08:00 146/62 95 HR 71 Surgery end at 08;03 K. Verta, RN

## 2024-03-31 NOTE — Op Note (Signed)
 OPERATIVE NOTE  JAELINE WHOBREY [id number] 03/31/2024   PREOPERATIVE DIAGNOSIS:  Nuclear sclerotic cataract right eye.  H25.11   POSTOPERATIVE DIAGNOSIS:    Nuclear sclerotic cataract right eye.     PROCEDURE:  Phacoemusification with posterior chamber intraocular lens placement of the right eye   LENS:   Implant Name Type Inv. Item Serial No. Manufacturer Lot No. LRB No. Used Action  LENS IOL TECNIS EYHANCE 26.5 - [id number] Intraocular Lens LENS IOL TECNIS EYHANCE 26.5 [id number] SIGHTPATH  Right 1 Implanted       Procedure(s): PHACOEMULSIFICATION, CATARACT, WITH IOL INSERTION 14.69 01:19.9 (Right)  SURGEON:  Adine Novak, MD, MPH  ANESTHESIOLOGIST: No anesthesia staff entered.   ANESTHESIA:  Topical with tetracaine  drops augmented with 1% preservative-free intracameral lidocaine .  No IV anesthesia administered, per patient request.  ESTIMATED BLOOD LOSS: less than 1 mL.   COMPLICATIONS:  None.   DESCRIPTION OF PROCEDURE:  The patient was identified in the holding room and transported to the operating room and placed in the supine position under the operating microscope.  The right eye was identified as the operative eye and it was prepped and draped in the usual sterile ophthalmic fashion.   A 1.0 millimeter clear-corneal paracentesis was made at the 10:30 position. 0.5 ml of preservative-free 1% lidocaine  with epinephrine  was injected into the anterior chamber.  The anterior chamber was filled with viscoelastic.  A 2.4 millimeter keratome was used to make a near-clear corneal incision at the 8:00 position.  A curvilinear capsulorrhexis was made with a cystotome and capsulorrhexis forceps.  Balanced salt solution was used to hydrodissect and hydrodelineate the nucleus.   Phacoemulsification was then used in stop and chop fashion to remove the lens nucleus and epinucleus.  The remaining cortex was then removed using the irrigation and aspiration handpiece. Viscoelastic was then  placed into the capsular bag to distend it for lens placement.  A lens was then injected into the capsular bag.  The remaining viscoelastic was aspirated.   Wounds were hydrated with balanced salt solution.  The anterior chamber was inflated to a physiologic pressure with balanced salt solution.   Intracameral vigamox  0.1 mL undiluted was injected into the eye and a drop placed onto the ocular surface.  No wound leaks were noted.  The patient was taken to the recovery room in stable condition without complications of anesthesia or surgery  Adine Novak 03/31/2024, 8:05 AM

## 2024-03-31 NOTE — Progress Notes (Signed)
 Patient did not want any sedation. Discussed w/Dr. Myrna and he is fine with that, so no sedation administered to this very nice lady.

## 2024-03-31 NOTE — H&P (Signed)
 [facility]   Primary Care Physician:  Glendia Shad, MD Ophthalmologist: Dr. Adine Novak  Pre-Procedure History & Physical: HPI:  Tina Watson is a 89 y.o. female here for cataract surgery.   Past Medical History:  Diagnosis Date   Anemia    Cancer (HCC)    Skin   Chicken pox    Colon polyps    Diabetes mellitus without complication (HCC)    Diverticulosis    Granuloma annulare    Hypercholesterolemia    Hypertension    Osteoporosis     Past Surgical History:  Procedure Laterality Date   MOHS SURGERY  07/2014   BCCA on nose   TONSILLECTOMY  1952   vaginal hysterectomy with anterior repair  1990    Prior to Admission medications   Medication Sig Start Date End Date Taking? Authorizing Provider  Calcium Carbonate-Vitamin D  600-400 MG-UNIT tablet Take 1 tablet by mouth daily.   Yes [provider]  ferrous sulfate  325 (65 FE) MG tablet TAKE 1 TABLET BY MOUTH DAILY 06/03/14  Yes Glendia Shad, MD  fish oil-omega-3 fatty acids 1000 MG capsule Take 1 g by mouth daily.   Yes [provider]  Multiple Vitamin (MULTIVITAMIN) tablet Take 1 tablet by mouth daily.   Yes [provider]  telmisartan  (MICARDIS ) 40 MG tablet Take 1 tablet (40 mg total) by mouth daily. 01/21/24  Yes Glendia Shad, MD  triamterene -hydrochlorothiazide (MAXZIDE-25) 37.5-25 MG tablet Take 0.5 tablets by mouth daily. 01/21/24  Yes Glendia Shad, MD  glucose blood (BAYER CONTOUR NEXT TEST) test strip TEST ONCE DAILY 12/03/17   Glendia Shad, MD  latanoprost (XALATAN) 0.005 % ophthalmic solution Place 1 drop into both eyes at bedtime.    Hershal Ozell ORN, MD  mometasone (ELOCON) 0.1 % cream 2 (two) times daily. 03/21/23   [provider]  mometasone (ELOCON) 0.1 % lotion SMARTSIG:In Ear(s) 03/21/23   [provider]    Allergies as of 02/18/2024 - Review Complete 01/21/2024  Allergen Reaction Noted   Demerol [meperidine] Nausea And Vomiting  09/13/2012   Fluvastatin  07/21/2014   Lescol [fluvastatin sodium]  09/13/2012    Family History  Problem Relation Age of Onset   Cancer Mother        question of rectum/vaginal   Stroke Father    Kidney failure Father    Stroke Sister    Dementia Sister    Pneumonia Sister    Heart disease Brother        x4   Breast cancer Neg Hx    Colon cancer Neg Hx     Social History   Socioeconomic History   Marital status: Widowed    Spouse name: Not on file   Number of children: 2   Years of education: Not on file   Highest education level: Not on file  Occupational History   Not on file  Tobacco Use   Smoking status: Never   Smokeless tobacco: Never  Substance and Sexual Activity   Alcohol use: Yes    Comment: rare twice a year   Drug use: No   Sexual activity: Never  Other Topics Concern   Not on file  Social History Narrative   Widowed, 2 children, 1 daughter who lives with her   Social Drivers of Corporate investment banker Strain: Low Risk  (05/03/2023)   Overall Financial Resource Strain (CARDIA)    Difficulty of Paying Living Expenses: Not hard at all  Food Insecurity:  No Food Insecurity (05/03/2023)   Hunger Vital Sign    Worried About Running Out of Food in the Last Year: Never true    Ran Out of Food in the Last Year: Never true  Transportation Needs: No Transportation Needs (05/03/2023)   PRAPARE - Administrator, Civil Service (Medical): No    Lack of Transportation (Non-Medical): No  Physical Activity: Sufficiently Active (05/03/2023)   Exercise Vital Sign    Days of Exercise per Week: 7 days    Minutes of Exercise per Session: 30 min  Stress: No Stress Concern Present (05/03/2023)   [employer] of Occupational Health - Occupational Stress Questionnaire    Feeling of Stress : Only a little  Social Connections: Moderately Integrated (05/03/2023)   Social Connection and Isolation Panel    Frequency of Communication with Friends and Family:  More than three times a week    Frequency of Social Gatherings with Friends and Family: Twice a week    Attends Religious Services: More than 4 times per year    Active Member of [employer] or Organizations: Yes    Attends Banker Meetings: More than 4 times per year    Marital Status: Widowed  Intimate Partner Violence: Not At Risk (05/03/2023)   Humiliation, Afraid, Rape, and Kick questionnaire    Fear of Current or Ex-Partner: No    Emotionally Abused: No    Physically Abused: No    Sexually Abused: No    Review of Systems: See HPI, otherwise negative ROS  Physical Exam: BP (!) 148/64   Pulse 74   Temp 98 F (36.7 C) (Temporal)   Ht 5' 3 (1.6 m)   Wt 68.9 kg   LMP 09/13/1984   SpO2 97%   BMI 26.93 kg/m  General:   Alert, cooperative. Head:  Normocephalic and atraumatic. Respiratory:  Normal work of breathing. Cardiovascular:  NAD  Impression/Plan: Tina Watson is here for cataract surgery.  Risks, benefits, limitations, and alternatives regarding cataract surgery have been reviewed with the patient.  Questions have been answered.  All parties agreeable.   Adine Novak, MD  03/31/2024, 7:09 AM

## 2024-04-01 ENCOUNTER — Encounter: Payer: Self-pay | Admitting: Ophthalmology

## 2024-04-01 DIAGNOSIS — H2512 Age-related nuclear cataract, left eye: Secondary | ICD-10-CM | POA: Diagnosis not present

## 2024-04-04 ENCOUNTER — Encounter: Payer: Self-pay | Admitting: Ophthalmology

## 2024-04-04 NOTE — Anesthesia Preprocedure Evaluation (Addendum)
 Anesthesia Evaluation  Patient identified by MRN, date of birth, ID band Patient awake    Reviewed: Allergy & Precautions, H&P , NPO status , Patient's Chart, lab work & pertinent test results  Airway        Dental   Pulmonary neg pulmonary ROS          Cardiovascular hypertension, negative cardio ROS      Neuro/Psych  Neuromuscular disease negative neurological ROS  negative psych ROS   GI/Hepatic negative GI ROS, Neg liver ROS,,,  Endo/Other  negative endocrine ROSdiabetes    Renal/GU negative Renal ROS  negative genitourinary   Musculoskeletal negative musculoskeletal ROS (+)    Abdominal   Peds negative pediatric ROS (+)  Hematology negative hematology ROS (+) Blood dyscrasia, anemia   Anesthesia Other Findings Previous cataract surgery 03-31-24 patient does not want anesthesia, Dr. Ola discussed w/patient.   Hypertension Hypercholesterolemia Granuloma annulare Osteoporosis Diverticulosis Anemia Diabetes mellitus without complication (HCC) Cancer (HCC) Colon polyps Chicken pox    Reproductive/Obstetrics negative OB ROS                              Anesthesia Physical Anesthesia Plan  ASA: 3  Anesthesia Plan: MAC   Post-op Pain Management:    Induction: Intravenous  PONV Risk Score and Plan:   Airway Management Planned: Natural Airway and Nasal Cannula  Additional Equipment:   Intra-op Plan:   Post-operative Plan:   Informed Consent: I have reviewed the patients History and Physical, chart, labs and discussed the procedure including the risks, benefits and alternatives for the proposed anesthesia with the patient or authorized representative who has indicated his/her understanding and acceptance.     Dental Advisory Given  Plan Discussed with: Anesthesiologist, CRNA and Surgeon  Anesthesia Plan Comments: (Patient consented for risks of anesthesia including  but not limited to:  - adverse reactions to medications - damage to eyes, teeth, lips or other oral mucosa - nerve damage due to positioning  - sore throat or hoarseness - Damage to heart, brain, nerves, lungs, other parts of body or loss of life  Patient voiced understanding and assent.)         Anesthesia Quick Evaluation

## 2024-04-10 NOTE — Discharge Instructions (Signed)

## 2024-04-14 ENCOUNTER — Encounter
Admission: RE | Disposition: A | Discharge status: Home / Self Care | Payer: Self-pay | Source: Home / Self Care | Attending: Ophthalmology

## 2024-04-14 ENCOUNTER — Ambulatory Visit
Admission: RE | Admit: 2024-04-14 | Discharge: 2024-04-14 | Disposition: A | Discharge status: Home / Self Care | Attending: Ophthalmology | Admitting: Ophthalmology

## 2024-04-14 ENCOUNTER — Encounter: Payer: Self-pay | Admitting: Ophthalmology

## 2024-04-14 ENCOUNTER — Other Ambulatory Visit: Payer: Self-pay

## 2024-04-14 ENCOUNTER — Ambulatory Visit: Payer: Self-pay | Admitting: Anesthesiology

## 2024-04-14 DIAGNOSIS — D649 Anemia, unspecified: Secondary | ICD-10-CM | POA: Diagnosis not present

## 2024-04-14 DIAGNOSIS — D759 Disease of blood and blood-forming organs, unspecified: Secondary | ICD-10-CM | POA: Insufficient documentation

## 2024-04-14 DIAGNOSIS — I10 Essential (primary) hypertension: Secondary | ICD-10-CM | POA: Insufficient documentation

## 2024-04-14 DIAGNOSIS — H2512 Age-related nuclear cataract, left eye: Secondary | ICD-10-CM | POA: Diagnosis not present

## 2024-04-14 DIAGNOSIS — E1136 Type 2 diabetes mellitus with diabetic cataract: Secondary | ICD-10-CM | POA: Diagnosis not present

## 2024-04-14 HISTORY — PX: CATARACT EXTRACTION W/PHACO: SHX586

## 2024-04-14 SURGERY — PHACOEMULSIFICATION, CATARACT, WITH IOL INSERTION
Anesthesia: Topical | Site: Eye | Laterality: Left

## 2024-04-14 MED ORDER — LIDOCAINE HCL (PF) 2 % IJ SOLN
INTRAOCULAR | Status: DC | PRN
  Administered 2024-04-14: 4 mL via INTRAOCULAR

## 2024-04-14 MED ORDER — SIGHTPATH DOSE#1 BSS IO SOLN
INTRAOCULAR | Status: DC | PRN
  Administered 2024-04-14: 15 mL via INTRAOCULAR

## 2024-04-14 MED ORDER — ARMC OPHTHALMIC DILATING DROPS
1.0000 | OPHTHALMIC | Status: DC | PRN
Start: 2024-04-14 — End: 2024-04-14
  Administered 2024-04-14 (×3): 1 via OPHTHALMIC

## 2024-04-14 MED ORDER — SIGHTPATH DOSE#1 NA HYALUR & NA CHOND-NA HYALUR IO KIT
PACK | INTRAOCULAR | Status: DC | PRN
  Administered 2024-04-14: 1 via OPHTHALMIC

## 2024-04-14 MED ORDER — MOXIFLOXACIN HCL 0.5 % OP SOLN
OPHTHALMIC | Status: DC | PRN
  Administered 2024-04-14: .2 mL via OPHTHALMIC

## 2024-04-14 MED ORDER — TETRACAINE HCL 0.5 % OP SOLN
OPHTHALMIC | Status: AC
  Filled 2024-04-14: qty 4

## 2024-04-14 MED ORDER — TETRACAINE HCL 0.5 % OP SOLN
1.0000 [drp] | OPHTHALMIC | Status: DC | PRN
  Administered 2024-04-14 (×3): 1 [drp] via OPHTHALMIC

## 2024-04-14 MED ORDER — FENTANYL CITRATE (PF) 100 MCG/2ML IJ SOLN
INTRAMUSCULAR | Status: AC
Start: 2024-04-14 — End: 2024-04-14
  Filled 2024-04-14: qty 2

## 2024-04-14 MED ORDER — ARMC OPHTHALMIC DILATING DROPS
OPHTHALMIC | Status: AC
  Filled 2024-04-14: qty 0.5

## 2024-04-14 MED ORDER — SIGHTPATH DOSE#1 BSS IO SOLN
INTRAOCULAR | Status: DC | PRN
  Administered 2024-04-14: 105 mL via OPHTHALMIC

## 2024-04-14 MED ORDER — LACTATED RINGERS IV SOLN: INTRAVENOUS | Status: DC

## 2024-04-14 SURGICAL SUPPLY — 10 items
CATARACT SUITE SIGHTPATH (MISCELLANEOUS) ×1 IMPLANT
DISSECTOR HYDRO NUCLEUS 50X22 (MISCELLANEOUS) ×1 IMPLANT
FEE CATARACT SUITE SIGHTPATH (MISCELLANEOUS) ×1 IMPLANT
GLOVE PI ULTRA LF STRL 7.5 (GLOVE) ×1 IMPLANT
GLOVE SURG SYN 6.5 PF PI BL (GLOVE) ×1 IMPLANT
GLOVE SURG SYN 8.5 PF PI BL (GLOVE) ×1 IMPLANT
LENS IOL TECNIS EYHANCE 26.5 (Intraocular Lens) IMPLANT
NDL FILTER BLUNT 18X1 1/2 (NEEDLE) ×1 IMPLANT
NEEDLE FILTER BLUNT 18X1 1/2 (NEEDLE) ×1 IMPLANT
SYR 3ML LL SCALE MARK (SYRINGE) ×1 IMPLANT

## 2024-04-14 NOTE — H&P (Signed)
 [facility]   Primary Care Physician:  Glendia Shad, MD Ophthalmologist: Dr. Adine Novak  Pre-Procedure History & Physical: HPI:  Tina Watson is a 89 y.o. female here for cataract surgery.   Past Medical History:  Diagnosis Date   Anemia    Cancer (HCC)    Skin   Chicken pox    Colon polyps    Diabetes mellitus without complication (HCC)    Diverticulosis    Granuloma annulare    Hypercholesterolemia    Hypertension    Osteoporosis     Past Surgical History:  Procedure Laterality Date   CATARACT EXTRACTION W/PHACO Right 03/31/2024   Procedure: PHACOEMULSIFICATION, CATARACT, WITH IOL INSERTION 14.69 01:19.9;  Surgeon: Novak Adine Anes, MD;  Location: [facility];  Service: Ophthalmology;  Laterality: Right;   MOHS SURGERY  07/2014   BCCA on nose   TONSILLECTOMY  1952   vaginal hysterectomy with anterior repair  1990    Prior to Admission medications   Medication Sig Start Date End Date Taking? Authorizing Provider  Calcium Carbonate-Vitamin D  600-400 MG-UNIT tablet Take 1 tablet by mouth daily.   Yes [provider]  ferrous sulfate  325 (65 FE) MG tablet TAKE 1 TABLET BY MOUTH DAILY 06/03/14  Yes Glendia Shad, MD  fish oil-omega-3 fatty acids 1000 MG capsule Take 1 g by mouth daily.   Yes [provider]  latanoprost (XALATAN) 0.005 % ophthalmic solution Place 1 drop into both eyes at bedtime.   Yes Hershal Ozell ORN, MD  mometasone (ELOCON) 0.1 % cream 2 (two) times daily. 03/21/23  Yes [provider]  mometasone (ELOCON) 0.1 % lotion SMARTSIG:In Ear(s) 03/21/23  Yes [provider]  Multiple Vitamin (MULTIVITAMIN) tablet Take 1 tablet by mouth daily.   Yes [provider]  telmisartan  (MICARDIS ) 40 MG tablet Take 1 tablet (40 mg total) by mouth daily. 01/21/24  Yes Glendia Shad, MD  triamterene -hydrochlorothiazide (MAXZIDE-25) 37.5-25 MG tablet Take 0.5 tablets by mouth daily. 01/21/24  Yes Glendia Shad, MD  glucose blood (BAYER CONTOUR NEXT TEST) test strip TEST ONCE DAILY 12/03/17   Glendia Shad, MD    Allergies as of 02/18/2024 - Review Complete 01/21/2024  Allergen Reaction Noted   Demerol [meperidine] Nausea And Vomiting 09/13/2012   Fluvastatin  07/21/2014   Lescol [fluvastatin sodium]  09/13/2012    Family History  Problem Relation Age of Onset   Cancer Mother        question of rectum/vaginal   Stroke Father    Kidney failure Father    Stroke Sister    Dementia Sister    Pneumonia Sister    Heart disease Brother        x4   Breast cancer Neg Hx    Colon cancer Neg Hx     Social History   Socioeconomic History   Marital status: Widowed    Spouse name: Not on file   Number of children: 2   Years of education: Not on file   Highest education level: Not on file  Occupational History   Not on file  Tobacco Use   Smoking status: Never   Smokeless tobacco: Never  Substance and Sexual Activity   Alcohol use: Yes    Comment: rare twice a year   Drug use: No   Sexual activity: Never  Other Topics Concern   Not on file  Social History Narrative   Widowed, 2 children, 1 daughter who lives with her   Social  Drivers of Health   Financial Resource Strain: Low Risk  (05/03/2023)   Overall Financial Resource Strain (CARDIA)    Difficulty of Paying Living Expenses: Not hard at all  Food Insecurity: No Food Insecurity (05/03/2023)   Hunger Vital Sign    Worried About Running Out of Food in the Last Year: Never true    Ran Out of Food in the Last Year: Never true  Transportation Needs: No Transportation Needs (05/03/2023)   PRAPARE - Administrator, Civil Service (Medical): No    Lack of Transportation (Non-Medical): No  Physical Activity: Sufficiently Active (05/03/2023)   Exercise Vital Sign    Days of Exercise per Week: 7 days    Minutes of Exercise per Session: 30 min  Stress: No Stress Concern Present (05/03/2023)   [employer] of  Occupational Health - Occupational Stress Questionnaire    Feeling of Stress : Only a little  Social Connections: Moderately Integrated (05/03/2023)   Social Connection and Isolation Panel    Frequency of Communication with Friends and Family: More than three times a week    Frequency of Social Gatherings with Friends and Family: Twice a week    Attends Religious Services: More than 4 times per year    Active Member of [employer] or Organizations: Yes    Attends Banker Meetings: More than 4 times per year    Marital Status: Widowed  Intimate Partner Violence: Not At Risk (05/03/2023)   Humiliation, Afraid, Rape, and Kick questionnaire    Fear of Current or Ex-Partner: No    Emotionally Abused: No    Physically Abused: No    Sexually Abused: No    Review of Systems: See HPI, otherwise negative ROS  Physical Exam: BP (!) 190/100   Pulse 74   Temp 97.9 F (36.6 C)   Resp 10   Ht 5' 3 (1.6 m)   Wt 69.4 kg   LMP 09/13/1984   SpO2 96%   BMI 27.10 kg/m  General:   Alert, cooperative. Head:  Normocephalic and atraumatic. Respiratory:  Normal work of breathing. Cardiovascular:  NAD  Impression/Plan: Tina Watson is here for cataract surgery.  She elects only local anesthesia.  Risks, benefits, limitations, and alternatives regarding cataract surgery have been reviewed with the patient.  Questions have been answered.  All parties agreeable.   Adine Novak, MD  04/14/2024, 8:59 AM

## 2024-04-14 NOTE — Op Note (Signed)
 OPERATIVE NOTE  Tina Watson [id number] 04/14/2024   PREOPERATIVE DIAGNOSIS:  Nuclear sclerotic cataract left eye.  H25.12   POSTOPERATIVE DIAGNOSIS:    Nuclear sclerotic cataract left eye.     PROCEDURE:  Phacoemusification with posterior chamber intraocular lens placement of the left eye   LENS:   Implant Name Type Inv. Item Serial No. Manufacturer Lot No. LRB No. Used Action  LENS IOL TECNIS EYHANCE 26.5 - [id number] Intraocular Lens LENS IOL TECNIS EYHANCE 26.5 [id number] SIGHTPATH  Left 1 Implanted      Procedure(s): PHACOEMULSIFICATION, CATARACT, WITH IOL INSERTION 12.04 01:15.7 (Left)  SURGEON:  Adine Novak, MD, MPH   ANESTHESIA:  Topical with tetracaine  drops augmented with 1% preservative-free intracameral lidocaine . No IV anesthesia per patient request (she is a retired Scientist, forensic).  ESTIMATED BLOOD LOSS: <1 mL   COMPLICATIONS:  None.   DESCRIPTION OF PROCEDURE:  The patient was identified in the holding room and transported to the operating room and placed in the supine position under the operating microscope.  The left eye was identified as the operative eye and it was prepped and draped in the usual sterile ophthalmic fashion.   A 1.0 millimeter clear-corneal paracentesis was made at the 5:00 position. 0.5 ml of preservative-free 1% lidocaine  with epinephrine  was injected into the anterior chamber.  The anterior chamber was filled with viscoelastic.  A 2.4 millimeter keratome was used to make a near-clear corneal incision at the 2:00 position.  A curvilinear capsulorrhexis was made with a cystotome and capsulorrhexis forceps.  Balanced salt solution was used to hydrodissect and hydrodelineate the nucleus.   Phacoemulsification was then used in stop and chop fashion to remove the lens nucleus and epinucleus.  The remaining cortex was then removed using the irrigation and aspiration handpiece. Viscoelastic was then placed into the capsular bag to distend it for lens placement.   A lens was then injected into the capsular bag.  The remaining viscoelastic was aspirated.   Wounds were hydrated with balanced salt solution.  The anterior chamber was inflated to a physiologic pressure with balanced salt solution.  Intracameral vigamox  0.1 mL undiltued was injected into the eye and a drop placed onto the ocular surface.  No wound leaks were noted.  The patient was taken to the recovery room in stable condition without complications of anesthesia or surgery  Adine Novak 04/14/2024, 9:33 AM

## 2024-04-14 NOTE — OR Nursing (Signed)
 Patient does not want anesthesia for surgery. Patient entered the OR at 9:11am and was promptly connected to BP, EKG an O2 sensor.   0912 BP: 152/94 P: 74 O2: 96%  0915 BP: 179/72 P: 70 O2: 93%  0920 BP: 174/73 P: 77 O2: 93%  0925 BP:170/76 P: 75 O2: 94%  0930 BP: 173/77 P: 77 O2: 95%  Surgery ended at 9:31AM

## 2024-05-08 DIAGNOSIS — Z961 Presence of intraocular lens: Secondary | ICD-10-CM | POA: Diagnosis not present

## 2024-05-28 ENCOUNTER — Ambulatory Visit (INDEPENDENT_AMBULATORY_CARE_PROVIDER_SITE_OTHER): Admitting: *Deleted

## 2024-05-28 VITALS — BP 131/62 | HR 71 | Ht 62.0 in | Wt 153.2 lb

## 2024-05-28 DIAGNOSIS — Z0000 Encounter for general adult medical examination without abnormal findings: Secondary | ICD-10-CM | POA: Diagnosis not present

## 2024-05-28 NOTE — Patient Instructions (Signed)
 Tina Watson,  Thank you for taking the time for your Medicare Wellness Visit. I appreciate your continued commitment to your health goals. Please review the care plan we discussed, and feel free to reach out if I can assist you further.  Medicare recommends these wellness visits once per year to help you and your care team stay ahead of potential health issues. These visits are designed to focus on prevention, allowing your provider to concentrate on managing your acute and chronic conditions during your regular appointments.  Please note that Annual Wellness Visits do not include a physical exam. Some assessments may be limited, especially if the visit was conducted virtually. If needed, we may recommend a separate in-person follow-up with your provider.  Ongoing Care Seeing your primary care provider every 3 to 6 months helps us  monitor your health and provide consistent, personalized care.  Remember to update your flu and tetanus (Tdap) vaccines.  Referrals If a referral was made during today's visit and you haven't received any updates within two weeks, please contact the referred provider directly to check on the status.  Recommended Screenings:  Health Maintenance  Topic Date Due   DTaP/Tdap/Td vaccine (1 - Tdap) Never done   Complete foot exam   04/11/2023   Flu Shot  04/11/2024   COVID-19 Vaccine (4 - 2025-26 season) 05/12/2024   Breast Cancer Screening  12/25/2036*   Hemoglobin A1C  07/23/2024   Eye exam for diabetics  02/13/2025   Medicare Annual Wellness Visit  05/28/2025   Pneumococcal Vaccine for age over 69  Completed   DEXA scan (bone density measurement)  Completed   Zoster (Shingles) Vaccine  Completed   HPV Vaccine  Aged Out   Meningitis B Vaccine  Aged Out  *Topic was postponed. The date shown is not the original due date.       05/28/2024   10:16 AM  Advanced Directives  Does Patient Have a Medical Advance Directive? Yes  Type of Engineer, mining of [place];Living will  Copy of Healthcare Power of Attorney in Chart? No - copy requested   Advance Care Planning is important because it: Ensures you receive medical care that aligns with your values, goals, and preferences. Provides guidance to your family and loved ones, reducing the emotional burden of decision-making during critical moments.  Vision: Annual vision screenings are recommended for early detection of glaucoma, cataracts, and diabetic retinopathy. These exams can also reveal signs of chronic conditions such as diabetes and high blood pressure.  Dental: Annual dental screenings help detect early signs of oral cancer, gum disease, and other conditions linked to overall health, including heart disease and diabetes.  Please see the attached documents for additional preventive care recommendations.

## 2024-05-28 NOTE — Progress Notes (Signed)
 Subjective:   Tina Watson is a 89 y.o. who presents for a Medicare Wellness preventive visit.  As a reminder, Annual Wellness Visits don't include a physical exam, and some assessments may be limited, especially if this visit is performed virtually. We may recommend an in-person follow-up visit with your provider if needed.  Visit Complete: Virtual I connected with  Tina Watson on 05/28/24 by a audio enabled telemedicine application and verified that I am speaking with the correct person using two identifiers.  Patient Location: Home  Provider Location: Home Office  I discussed the limitations of evaluation and management by telemedicine. The patient expressed understanding and agreed to proceed.  Vital Signs: Because this visit was a virtual/telehealth visit, some criteria may be missing or patient reported. Any vitals not documented were not able to be obtained and vitals that have been documented are patient reported.  VideoDeclined- This patient declined Librarian, academic. Therefore the visit was completed with audio only.  Persons Participating in Visit: Patient.  AWV Questionnaire: No: Patient Medicare AWV questionnaire was not completed prior to this visit.  Cardiac Risk Factors include: advanced age (>19men, >1 women);diabetes mellitus;hypertension;dyslipidemia     Objective:    Today's Vitals   05/28/24 0956 05/28/24 1126  BP: (!) 140/75 131/62  Pulse: 79 71  Weight: 153 lb 4 oz (69.5 kg)   Height: 5' 2 (1.575 m)    Body mass index is 28.03 kg/m.     05/28/2024   10:16 AM 04/14/2024    8:26 AM 03/31/2024    6:57 AM 05/03/2023   10:50 AM 02/24/2022   10:26 AM 02/23/2021   11:40 AM 02/23/2020   11:12 AM  Advanced Directives  Does Patient Have a Medical Advance Directive? Yes Yes Yes Yes Yes No Yes  Type of Estate agent of [place];Living will Healthcare Power of [place];Living will Healthcare Power of  [place];Living will Healthcare Power of [place];Living will Healthcare Power of [place];Living will  Healthcare Power of [place];Living will  Does patient want to make changes to medical advance directive?  No - Patient declined No - Patient declined No - Patient declined No - Patient declined  No - Patient declined  Copy of Healthcare Power of Attorney in Chart? No - copy requested No - copy requested Yes - validated most recent copy scanned in chart (See row information) No - copy requested No - copy requested  No - copy requested  Would patient like information on creating a medical advance directive?      No - Patient declined     Current Medications (verified) Outpatient Encounter Medications as of 05/28/2024  Medication Sig   Calcium Carbonate-Vitamin D  600-400 MG-UNIT tablet Take 1 tablet by mouth daily.   ferrous sulfate  325 (65 FE) MG tablet TAKE 1 TABLET BY MOUTH DAILY   fish oil-omega-3 fatty acids 1000 MG capsule Take 1 g by mouth daily.   glucose blood (BAYER CONTOUR NEXT TEST) test strip TEST ONCE DAILY   latanoprost (XALATAN) 0.005 % ophthalmic solution Place 1 drop into both eyes at bedtime.   mometasone (ELOCON) 0.1 % cream 2 (two) times daily.   mometasone (ELOCON) 0.1 % lotion SMARTSIG:In Ear(s)   Multiple Vitamin (MULTIVITAMIN) tablet Take 1 tablet by mouth daily.   telmisartan  (MICARDIS ) 40 MG tablet Take 1 tablet (40 mg total) by mouth daily.   triamterene -hydrochlorothiazide (MAXZIDE-25) 37.5-25 MG tablet Take 0.5 tablets by mouth daily.   No facility-administered encounter medications on  file as of 05/28/2024.    Allergies (verified) Demerol [meperidine], Fluvastatin, and Lescol [fluvastatin sodium]   History: Past Medical History:  Diagnosis Date   Anemia    Cancer (HCC)    Skin   Chicken pox    Colon polyps    Diabetes mellitus without complication (HCC)    Diverticulosis    Granuloma annulare    Hypercholesterolemia    Hypertension    Osteoporosis     Past Surgical History:  Procedure Laterality Date   CATARACT EXTRACTION W/PHACO Right 03/31/2024   Procedure: PHACOEMULSIFICATION, CATARACT, WITH IOL INSERTION 14.69 01:19.9;  Surgeon: Myrna Adine Anes, MD;  Location: [facility];  Service: Ophthalmology;  Laterality: Right;   CATARACT EXTRACTION W/PHACO Left 04/14/2024   Procedure: PHACOEMULSIFICATION, CATARACT, WITH IOL INSERTION 12.04 01:15.7;  Surgeon: Myrna Adine Anes, MD;  Location: [facility];  Service: Ophthalmology;  Laterality: Left;   MOHS SURGERY  07/2014   BCCA on nose   TONSILLECTOMY  1952   vaginal hysterectomy with anterior repair  1990   Family History  Problem Relation Age of Onset   Cancer Mother        question of rectum/vaginal   Stroke Father    Kidney failure Father    Stroke Sister    Dementia Sister    Pneumonia Sister    Heart disease Brother        x4   Breast cancer Neg Hx    Colon cancer Neg Hx    Social History   Socioeconomic History   Marital status: Widowed    Spouse name: Not on file   Number of children: 2   Years of education: Not on file   Highest education level: Not on file  Occupational History   Not on file  Tobacco Use   Smoking status: Never   Smokeless tobacco: Never  Substance and Sexual Activity   Alcohol use: Yes    Comment: rare twice a year   Drug use: No   Sexual activity: Never  Other Topics Concern   Not on file  Social History Narrative   Widowed, 2 children, 1 daughter who lives with her   Social Drivers of Corporate investment banker Strain: Low Risk  (05/28/2024)   Overall Financial Resource Strain (CARDIA)    Difficulty of Paying Living Expenses: Not hard at all  Food Insecurity: No Food Insecurity (05/28/2024)   Hunger Vital Sign    Worried About Running Out of Food in the Last Year: Never true    Ran Out of Food in the Last Year: Never true  Transportation Needs: No Transportation Needs (05/28/2024)   PRAPARE - Therapist, art (Medical): No    Lack of Transportation (Non-Medical): No  Physical Activity: Inactive (05/28/2024)   Exercise Vital Sign    Days of Exercise per Week: 0 days    Minutes of Exercise per Session: 0 min  Stress: No Stress Concern Present (05/28/2024)   [employer] of Occupational Health - Occupational Stress Questionnaire    Feeling of Stress: Only a little  Social Connections: Moderately Integrated (05/28/2024)   Social Connection and Isolation Panel    Frequency of Communication with Friends and Family: More than three times a week    Frequency of Social Gatherings with Friends and Family: Three times a week    Attends Religious Services: More than 4 times per year    Active Member of Clubs or Organizations: Yes  Attends Banker Meetings: More than 4 times per year    Marital Status: Widowed    Tobacco Counseling Counseling given: Not Answered    Clinical Intake:  Pre-visit preparation completed: Yes  Pain : No/denies pain     BMI - recorded: 28.03 Nutritional Status: BMI 25 -29 Overweight Nutritional Risks: None Diabetes: Yes CBG done?: Yes (FBS 147 per patient)  Lab Results  Component Value Date   HGBA1C 7.5 (H) 01/21/2024   HGBA1C 7.4 (H) 07/31/2023   HGBA1C 7.2 (H) 04/05/2023     How often do you need to have someone help you when you read instructions, pamphlets, or other written materials from your doctor or pharmacy?: 1 - Never  Interpreter Needed?: No  Information entered by :: R. Yaritzy Huser LPN   Activities of Daily Living     05/28/2024   10:00 AM 04/14/2024    8:29 AM  In your present state of health, do you have any difficulty performing the following activities:  Hearing? 1 0  Vision? 0 0  Difficulty concentrating or making decisions? 0 0  Walking or climbing stairs? 0   Dressing or bathing? 0   Doing errands, shopping? 0   Preparing Food and eating ? N   Using the Toilet? N   In the past six months, have  you accidently leaked urine? Y   Do you have problems with loss of bowel control? N   Managing your Medications? N   Managing your Finances? N   Housekeeping or managing your Housekeeping? N     Patient Care Team: Glendia Shad, MD as PCP - General (Internal Medicine) Myrna Adine Anes, MD as Consulting Physician (Ophthalmology) Cathlyn Seal, MD as Referring Physician (Dermatology) Collier Julianna LABOR, MD as Referring Physician (Audiology)  I have updated your Care Teams any recent Medical Services you may have received from other providers in the past year.     Assessment:   This is a routine wellness examination for [place].  Hearing/Vision screen Hearing Screening - Comments:: Wears aids Vision Screening - Comments:: glasses   Goals Addressed             This Visit's Progress    Patient Stated       Wants to try to continue to eat well       Depression Screen     05/28/2024   10:07 AM 05/03/2023   10:49 AM 03/29/2023   11:43 AM 08/11/2022    8:21 AM 04/10/2022    8:04 AM 02/24/2022   10:18 AM 02/23/2021   11:32 AM  PHQ 2/9 Scores  PHQ - 2 Score 0 0 0 0 0 0 0  PHQ- 9 Score 1 0 1        Fall Risk     05/28/2024   10:04 AM 05/03/2023   10:51 AM 03/29/2023   11:43 AM 08/11/2022    8:20 AM 04/10/2022    8:04 AM  Fall Risk   Falls in the past year? 0 1 1 0 0  Number falls in past yr: 0 0 0 0 0  Injury with Fall? 0 0 0 0 0  Risk for fall due to : No Fall Risks History of fall(s) No Fall Risks No Fall Risks No Fall Risks  Follow up Falls evaluation completed;Falls prevention discussed   Falls evaluation completed  Falls evaluation completed      Data saved with a previous flowsheet row definition    MEDICARE RISK AT HOME:  Medicare Risk at Home Any stairs in or around the home?: Yes If so, are there any without handrails?: No Home free of loose throw rugs in walkways, pet beds, electrical cords, etc?: Yes Adequate lighting in your home to reduce risk of  falls?: Yes Life alert?: No Use of a cane, walker or w/c?: No Grab bars in the bathroom?: No Shower chair or bench in shower?: No Elevated toilet seat or a handicapped toilet?: Yes  TIMED UP AND GO:  Was the test performed?  No  Cognitive Function: 6CIT completed    11/30/2015    3:55 PM  MMSE - Mini Mental State Exam  Orientation to time 5   Orientation to Place 5   Registration 3   Attention/ Calculation 5   Recall 3   Language- name 2 objects 2   Language- repeat 1  Language- follow 3 step command 3   Language- read & follow direction 1   Write a sentence 1   Copy design 1   Total score 30      Data saved with a previous flowsheet row definition        05/28/2024   10:17 AM 05/03/2023   10:54 AM 02/23/2021    1:16 PM 02/23/2020   11:24 AM 02/20/2019   11:52 AM  6CIT Screen  What Year? 0 points 0 points 0 points 0 points 0 points  What month? 0 points 0 points 0 points 0 points 0 points  What time? 0 points 0 points 0 points  0 points  Count back from 20 0 points 0 points 0 points  0 points  Months in reverse 0 points 0 points 0 points 0 points 0 points  Repeat phrase 0 points 0 points     Total Score 0 points 0 points       Immunizations Immunization History  Administered Date(s) Administered   Fluad Quad(high Dose 65+) 06/10/2019, 07/14/2020   Fluad Trivalent(High Dose 65+) 07/31/2023   INFLUENZA, HIGH DOSE SEASONAL PF 08/21/2016, 07/03/2017, 07/19/2018   Influenza Split 06/13/2012, 07/04/2012   Influenza,inj,Quad PF,6+ Mos 06/25/2013, 06/08/2014, 06/02/2015   Influenza-Unspecified 07/04/2012, 06/25/2013, 06/08/2014, 08/21/2016, 07/03/2017, 07/03/2017, 07/19/2018   PFIZER(Purple Top)SARS-COV-2 Vaccination 09/13/2019, 10/24/2019, 06/23/2020   Pneumococcal Conjugate-13 12/25/2016   Pneumococcal Polysaccharide-23 01/01/2018   Respiratory Syncytial Virus Vaccine,Recomb Aduvanted(Arexvy) 06/15/2022   Zoster Recombinant(Shingrix) 11/15/2023, 02/13/2024     Screening Tests Health Maintenance  Topic Date Due   DTaP/Tdap/Td (1 - Tdap) Never done   FOOT EXAM  04/11/2023   Influenza Vaccine  04/11/2024   COVID-19 Vaccine (4 - 2025-26 season) 05/12/2024   Mammogram  12/25/2036 (Originally 07/03/2015)   HEMOGLOBIN A1C  07/23/2024   OPHTHALMOLOGY EXAM  02/13/2025   Medicare Annual Wellness (AWV)  05/28/2025   Pneumococcal Vaccine: 50+ Years  Completed   DEXA SCAN  Completed   Zoster Vaccines- Shingrix  Completed   HPV VACCINES  Aged Out   Meningococcal B Vaccine  Aged Out    Health Maintenance Items Addressed: Discussed the need to update flu and tetanus vaccine. Declines covid.  Patient needs diabetic foot exam.   Additional Screening:  Vision Screening: Recommended annual ophthalmology exams for early detection of glaucoma and other disorders of the eye. Is the patient up to date with their annual eye exam?  Yes  Who is the provider or what is the name of the office in which the patient attends annual eye exams? [facility]  Dental Screening: Recommended annual dental exams for proper oral  hygiene  Community Resource Referral / Chronic Care Management: CRR required this visit?  No   CCM required this visit?  No   Plan:    I have personally reviewed and noted the following in the patient's chart:   Medical and social history Use of alcohol, tobacco or illicit drugs  Current medications and supplements including opioid prescriptions. Patient is not currently taking opioid prescriptions. Functional ability and status Nutritional status Physical activity Advanced directives List of other physicians Hospitalizations, surgeries, and ER visits in previous 12 months Vitals Screenings to include cognitive, depression, and falls Referrals and appointments  In addition, I have reviewed and discussed with patient certain preventive protocols, quality metrics, and best practice recommendations. A written personalized care plan  for preventive services as well as general preventive health recommendations were provided to patient.   Angeline Fredericks, LPN   [id number]   After Visit Summary: (MyChart) Due to this being a telephonic visit, the after visit summary with patients personalized plan was offered to patient via MyChart   Notes: Nothing significant to report at this time.

## 2024-07-22 DIAGNOSIS — L578 Other skin changes due to chronic exposure to nonionizing radiation: Secondary | ICD-10-CM | POA: Diagnosis not present

## 2024-07-22 DIAGNOSIS — L570 Actinic keratosis: Secondary | ICD-10-CM | POA: Diagnosis not present

## 2024-07-22 DIAGNOSIS — Z872 Personal history of diseases of the skin and subcutaneous tissue: Secondary | ICD-10-CM | POA: Diagnosis not present

## 2024-07-22 DIAGNOSIS — Z859 Personal history of malignant neoplasm, unspecified: Secondary | ICD-10-CM | POA: Diagnosis not present

## 2024-07-22 DIAGNOSIS — Z85828 Personal history of other malignant neoplasm of skin: Secondary | ICD-10-CM | POA: Diagnosis not present

## 2024-07-24 ENCOUNTER — Ambulatory Visit: Admitting: Internal Medicine

## 2024-07-24 ENCOUNTER — Encounter: Payer: Self-pay | Admitting: Internal Medicine

## 2024-07-24 VITALS — BP 120/70 | HR 73 | Temp 97.6°F | Ht 62.0 in | Wt 153.0 lb

## 2024-07-24 DIAGNOSIS — Z0000 Encounter for general adult medical examination without abnormal findings: Secondary | ICD-10-CM | POA: Diagnosis not present

## 2024-07-24 DIAGNOSIS — E1165 Type 2 diabetes mellitus with hyperglycemia: Secondary | ICD-10-CM

## 2024-07-24 DIAGNOSIS — T466X5A Adverse effect of antihyperlipidemic and antiarteriosclerotic drugs, initial encounter: Secondary | ICD-10-CM | POA: Diagnosis not present

## 2024-07-24 DIAGNOSIS — D649 Anemia, unspecified: Secondary | ICD-10-CM

## 2024-07-24 DIAGNOSIS — I10 Essential (primary) hypertension: Secondary | ICD-10-CM | POA: Diagnosis not present

## 2024-07-24 DIAGNOSIS — E7800 Pure hypercholesterolemia, unspecified: Secondary | ICD-10-CM | POA: Diagnosis not present

## 2024-07-24 DIAGNOSIS — Z23 Encounter for immunization: Secondary | ICD-10-CM | POA: Diagnosis not present

## 2024-07-24 DIAGNOSIS — G720 Drug-induced myopathy: Secondary | ICD-10-CM

## 2024-07-24 LAB — BASIC METABOLIC PANEL WITH GFR
BUN: 30 mg/dL — ABNORMAL HIGH (ref 6–23)
CO2: 29 meq/L (ref 19–32)
Calcium: 9.3 mg/dL (ref 8.4–10.5)
Chloride: 98 meq/L (ref 96–112)
Creatinine, Ser: 1.12 mg/dL (ref 0.40–1.20)
GFR: 41.99 mL/min — ABNORMAL LOW (ref >60.00)
Glucose, Bld: 125 mg/dL — ABNORMAL HIGH (ref 70–99)
Potassium: 4 meq/L (ref 3.5–5.1)
Sodium: 138 meq/L (ref 135–145)

## 2024-07-24 LAB — LIPID PANEL
Cholesterol: 331 mg/dL — ABNORMAL HIGH (ref 0–200)
HDL: 65.5 mg/dL (ref >39.00)
LDL Cholesterol: 211 mg/dL — ABNORMAL HIGH (ref 0–99)
NonHDL: 265.72
Total CHOL/HDL Ratio: 5
Triglycerides: 274 mg/dL — ABNORMAL HIGH (ref 0.0–149.0)
VLDL: 54.8 mg/dL — ABNORMAL HIGH (ref 0.0–40.0)

## 2024-07-24 LAB — TSH: TSH: 3.15 u[IU]/mL (ref 0.35–5.50)

## 2024-07-24 LAB — HEPATIC FUNCTION PANEL
ALT: 17 U/L (ref 0–35)
AST: 16 U/L (ref 0–37)
Albumin: 4.5 g/dL (ref 3.5–5.2)
Alkaline Phosphatase: 75 U/L (ref 39–117)
Bilirubin, Direct: 0 mg/dL (ref 0.0–0.3)
Total Bilirubin: 0.5 mg/dL (ref 0.2–1.2)
Total Protein: 7.2 g/dL (ref 6.0–8.3)

## 2024-07-24 LAB — HM DIABETES FOOT EXAM

## 2024-07-24 LAB — HEMOGLOBIN A1C: Hgb A1c MFr Bld: 7.8 % — ABNORMAL HIGH (ref 4.6–6.5)

## 2024-07-24 MED ORDER — TRIAMTERENE-HCTZ 37.5-25 MG PO TABS: 0.5000 | ORAL_TABLET | Freq: Every day | ORAL | 1 refills | Status: AC

## 2024-07-24 NOTE — Progress Notes (Signed)
 Subjective:    Patient ID: Tina Watson, female    DOB: 1927-12-27, 89 y.o.   MRN: [id number]  Patient here for  Chief Complaint  Patient presents with   Medical Management of Chronic Issues    YEARLY EXAM     HPI With past history of hypertension and hypercholesterolemia, comes in today to follow up on these issues as well as for a complete physical exam.  Continues on micardis  and triam/hydrochlorothiazide.  Reports blood pressures mostly 130-140 at home. No chest pain or sob reported. No abdominal pain or bowel change reported. Reports blood sugars - 130s in am. No recorded readings. Some fatigue.    Past Medical History:  Diagnosis Date   Anemia    Cancer (HCC)    Skin   Chicken pox    Colon polyps    Diabetes mellitus without complication (HCC)    Diverticulosis    Granuloma annulare    Hypercholesterolemia    Hypertension    Osteoporosis    Past Surgical History:  Procedure Laterality Date   CATARACT EXTRACTION W/PHACO Right 03/31/2024   Procedure: PHACOEMULSIFICATION, CATARACT, WITH IOL INSERTION 14.69 01:19.9;  Surgeon: Myrna Adine Anes, MD;  Location: [facility];  Service: Ophthalmology;  Laterality: Right;   CATARACT EXTRACTION W/PHACO Left 04/14/2024   Procedure: PHACOEMULSIFICATION, CATARACT, WITH IOL INSERTION 12.04 01:15.7;  Surgeon: Myrna Adine Anes, MD;  Location: [facility];  Service: Ophthalmology;  Laterality: Left;   MOHS SURGERY  07/2014   BCCA on nose   TONSILLECTOMY  1952   vaginal hysterectomy with anterior repair  1990   Family History  Problem Relation Age of Onset   Cancer Mother        question of rectum/vaginal   Stroke Father    Kidney failure Father    Stroke Sister    Dementia Sister    Pneumonia Sister    Heart disease Brother        x4   Breast cancer Neg Hx    Colon cancer Neg Hx    Social History   Socioeconomic History   Marital status: Widowed    Spouse name: Not on file   Number of children: 2    Years of education: Not on file   Highest education level: Not on file  Occupational History   Not on file  Tobacco Use   Smoking status: Never   Smokeless tobacco: Never  Substance and Sexual Activity   Alcohol use: Yes    Comment: rare twice a year   Drug use: No   Sexual activity: Never  Other Topics Concern   Not on file  Social History Narrative   Widowed, 2 children, 1 daughter who lives with her   Social Drivers of Corporate Investment Banker Strain: Low Risk  (05/28/2024)   Overall Financial Resource Strain (CARDIA)    Difficulty of Paying Living Expenses: Not hard at all  Food Insecurity: No Food Insecurity (05/28/2024)   Hunger Vital Sign    Worried About Running Out of Food in the Last Year: Never true    Ran Out of Food in the Last Year: Never true  Transportation Needs: No Transportation Needs (05/28/2024)   PRAPARE - Administrator, Civil Service (Medical): No    Lack of Transportation (Non-Medical): No  Physical Activity: Inactive (05/28/2024)   Exercise Vital Sign    Days of Exercise per Week: 0 days    Minutes of Exercise per Session: 0  min  Stress: No Stress Concern Present (05/28/2024)   [employer] of Occupational Health - Occupational Stress Questionnaire    Feeling of Stress: Only a little  Social Connections: Moderately Integrated (05/28/2024)   Social Connection and Isolation Panel    Frequency of Communication with Friends and Family: More than three times a week    Frequency of Social Gatherings with Friends and Family: Three times a week    Attends Religious Services: More than 4 times per year    Active Member of Clubs or Organizations: Yes    Attends Banker Meetings: More than 4 times per year    Marital Status: Widowed     Review of Systems  Constitutional:  Positive for fatigue. Negative for appetite change and unexpected weight change.  HENT:  Negative for congestion, sinus pressure and sore throat.   Eyes:   Negative for pain and visual disturbance.  Respiratory:  Negative for cough, chest tightness and shortness of breath.   Cardiovascular:  Negative for chest pain, palpitations and leg swelling.  Gastrointestinal:  Negative for abdominal pain, diarrhea, nausea and vomiting.  Genitourinary:  Negative for difficulty urinating and dysuria.  Musculoskeletal:  Negative for joint swelling and myalgias.  Skin:  Negative for color change and rash.  Neurological:  Negative for dizziness and headaches.  Hematological:  Negative for adenopathy. Does not bruise/bleed easily.  Psychiatric/Behavioral:  Negative for agitation and dysphoric mood.        Objective:     BP 120/70   Pulse 73   Temp 97.6 F (36.4 C) (Oral)   Ht 5' 2 (1.575 m)   Wt 153 lb (69.4 kg)   LMP 09/13/1984   SpO2 99%   BMI 27.98 kg/m  Wt Readings from Last 3 Encounters:  07/24/24 153 lb (69.4 kg)  05/28/24 153 lb 4 oz (69.5 kg)  04/14/24 153 lb (69.4 kg)    Physical Exam Vitals reviewed.  Constitutional:      General: She is not in acute distress.    Appearance: Normal appearance. She is well-developed.  HENT:     Head: Normocephalic and atraumatic.     Right Ear: External ear normal.     Left Ear: External ear normal.     Mouth/Throat:     Pharynx: No oropharyngeal exudate or posterior oropharyngeal erythema.  Eyes:     General: No scleral icterus.       Right eye: No discharge.        Left eye: No discharge.     Conjunctiva/sclera: Conjunctivae normal.  Neck:     Thyroid : No thyromegaly.  Cardiovascular:     Rate and Rhythm: Normal rate and regular rhythm.  Pulmonary:     Effort: No tachypnea, accessory muscle usage or respiratory distress.     Breath sounds: Normal breath sounds. No decreased breath sounds or wheezing.  Chest:  Breasts:    Right: No inverted nipple, mass, nipple discharge or tenderness (no axillary adenopathy).     Left: No inverted nipple, mass, nipple discharge or tenderness (no  axilarry adenopathy).  Abdominal:     General: Bowel sounds are normal.     Palpations: Abdomen is soft.     Tenderness: There is no abdominal tenderness.  Musculoskeletal:        General: No swelling or tenderness.     Cervical back: Neck supple.  Lymphadenopathy:     Cervical: No cervical adenopathy.  Skin:    Findings: No erythema or rash.  Neurological:     Mental Status: She is alert and oriented to person, place, and time.  Psychiatric:        Mood and Affect: Mood normal.        Behavior: Behavior normal.         Outpatient Encounter Medications as of 07/24/2024  Medication Sig   Calcium Carbonate-Vitamin D  600-400 MG-UNIT tablet Take 1 tablet by mouth daily.   ferrous sulfate  325 (65 FE) MG tablet TAKE 1 TABLET BY MOUTH DAILY   fish oil-omega-3 fatty acids 1000 MG capsule Take 1 g by mouth daily.   glucose blood (BAYER CONTOUR NEXT TEST) test strip TEST ONCE DAILY   latanoprost (XALATAN) 0.005 % ophthalmic solution Place 1 drop into both eyes at bedtime.   mometasone (ELOCON) 0.1 % cream 2 (two) times daily.   mometasone (ELOCON) 0.1 % lotion SMARTSIG:In Ear(s)   Multiple Vitamin (MULTIVITAMIN) tablet Take 1 tablet by mouth daily.   telmisartan  (MICARDIS ) 40 MG tablet Take 1 tablet (40 mg total) by mouth daily.   triamterene -hydrochlorothiazide (MAXZIDE-25) 37.5-25 MG tablet Take 0.5 tablets by mouth daily.   [DISCONTINUED] triamterene -hydrochlorothiazide (MAXZIDE-25) 37.5-25 MG tablet Take 0.5 tablets by mouth daily.   No facility-administered encounter medications on file as of 07/24/2024.     Lab Results  Component Value Date   WBC 9.5 07/31/2023   HGB 12.7 07/31/2023   HCT 37.7 07/31/2023   PLT 246.0 07/31/2023   GLUCOSE 125 (H) 07/24/2024   CHOL 331 (H) 07/24/2024   TRIG 274.0 (H) 07/24/2024   HDL 65.50 07/24/2024   LDLDIRECT 201.0 07/31/2023   LDLCALC 211 (H) 07/24/2024   ALT 17 07/24/2024   AST 16 07/24/2024   NA 138 07/24/2024   K 4.0 07/24/2024    CL 98 07/24/2024   CREATININE 1.12 07/24/2024   BUN 30 (H) 07/24/2024   CO2 29 07/24/2024   TSH 3.15 07/24/2024   HGBA1C 7.8 (H) 07/24/2024       Assessment & Plan:  Health care maintenance Assessment & Plan: Physical today 07/24/24.  Declines mammogram. Colonoscopy 12/2013.    Hypercholesterolemia Assessment & Plan: Intolerance to multiple statins. Had intolerance to zetia . Off all cholesterol medication now. Desires not to try another medication.  Follow cholesterol.  Low cholesterol diet and exercise.  Follow lipid panel. Check lipid panel today.   Orders: -     Lipid panel -     TSH -     Hepatic function panel  Type 2 diabetes mellitus with hyperglycemia, without long-term current use of insulin (HCC) Assessment & Plan: Sugars as outlined. Continue low carb diet and exercise. Follow met b and A1c.   Orders: -     Hemoglobin A1c -     Basic metabolic panel with GFR  Need for influenza vaccination -     Flu vaccine HIGH DOSE PF(Fluzone Trivalent)  Statin myopathy Assessment & Plan: Intolerant to multiple statins.    Primary hypertension Assessment & Plan: Continue triam/hctz and micardis .  Follow pressures.  Check metabolic panel. Blood pressure as outlined. No change today.    Anemia, unspecified type Assessment & Plan: Follow cbc.    Other orders -     Triamterene -HCTZ; Take 0.5 tablets by mouth daily.  Dispense: 45 tablet; Refill: 1     Allena Hamilton, MD

## 2024-07-24 NOTE — Assessment & Plan Note (Signed)
 Sugars as outlined. Continue low carb diet and exercise. Follow met b and A1c.

## 2024-07-24 NOTE — Assessment & Plan Note (Signed)
 Physical today 07/24/24.  Declines mammogram. Colonoscopy 12/2013.

## 2024-07-25 ENCOUNTER — Ambulatory Visit: Payer: Self-pay | Admitting: Internal Medicine

## 2024-07-28 ENCOUNTER — Encounter: Payer: Self-pay | Admitting: Internal Medicine

## 2024-07-28 NOTE — Assessment & Plan Note (Signed)
Intolerant to multiple statins. 

## 2024-07-28 NOTE — Assessment & Plan Note (Signed)
 Intolerance to multiple statins. Had intolerance to zetia . Off all cholesterol medication now. Desires not to try another medication.  Follow cholesterol.  Low cholesterol diet and exercise.  Follow lipid panel.  Check lipid panel today.

## 2024-07-28 NOTE — Assessment & Plan Note (Signed)
 Continue triam/hctz and micardis .  Follow pressures.  Check metabolic panel. Blood pressure as outlined. No change today.

## 2024-07-28 NOTE — Telephone Encounter (Signed)
 Copied from CRM #[id number]. Topic: Clinical - Lab/Test Results >> Jul 28, 2024  1:59 PM Mesmerise C wrote: Reason for CRM: Patient returning call for lab results read verbatim, patient understood had no questions

## 2024-07-28 NOTE — Assessment & Plan Note (Signed)
 Follow cbc.

## 2024-08-04 ENCOUNTER — Ambulatory Visit: Payer: Self-pay

## 2024-08-04 NOTE — Telephone Encounter (Signed)
 Please call and see if she can do a virtual visit.  If not, please confirm what her contact was with the child and please confirm her current symptoms. Confirm no sob. Had reported no cough earlier. Any cough now?  Increased congestion? Also confirm drug allergies?

## 2024-08-04 NOTE — Telephone Encounter (Signed)
 Spoke with pt and informed her of Dr. Freda recommendations. Pt gave a verbal understanding.

## 2024-08-04 NOTE — Telephone Encounter (Signed)
 FYI Only or Action Required?: Action required by provider: Pt reports she was exposed to whooping cough Friday, declines OV as symptoms mild, request PCP recommendations if any. This RN educated pt on home care, new-worsening symptoms, when to call back/seek emergent care. Pt verbalized understanding and agrees to plan.   Patient was last seen in primary care on 07/24/2024 by Glendia Shad, MD.  Called Nurse Triage reporting Nasal Congestion.  Symptoms began today.  Interventions attempted: Nothing.  Symptoms are: stable.  Triage Disposition: See Physician Within 24 Hours  Patient/caregiver understands and will follow disposition?: Yes   Copied from CRM #[id number]. Topic: Clinical - Red Word Triage >> Aug 04, 2024  8:35 AM Laymon HERO wrote: Red Word that prompted transfer to Nurse Triage: Exposed to whooping cough from a child at a car dealership on friday- >> Aug 04, 2024  8:37 AM Brittany M wrote: Patient is experiencing symptoms- sore throat some running nose Reason for Disposition  [1] Runny nose occurs AND [2] within 21 days of whooping cough EXPOSURE (Close Contact)  Answer Assessment - Initial Assessment Questions 1. ONSET: When did the nasal discharge start?      This AM 2. AMOUNT: How much discharge is there?      Mild 3. COUGH: Do you have a cough? If Yes, ask: Describe the color of your mucus. (e.g., clear, white, yellow, green)     None 4. RESPIRATORY DISTRESS: Describe your breathing.      None 5. FEVER: Do you have a fever? If Yes, ask: What is your temperature, how was it measured, and when did it start?     None  7. OTHER SYMPTOMS: Do you have any other symptoms? (e.g., earache, mouth sores, sore throat, wheezing)     Mild scratchy throat  Answer Assessment - Initial Assessment Questions 1. PLACE of EXPOSURE: Where were you when you were exposed to the whooping cough? (e.g., home, school, work)     Programme Researcher, [employer]/film/video 2. TYPE of EXPOSURE: How  much contact was there? (e.g., distance in feet or meters; duration; hugging, sitting together)     Child with consistent barking whooping cough per pt, exposure for 2 hours 3. DATE of EXPOSURE: When did the exposure occur? (e.g., days ago)     08/01/24 4. SYMPTOMS: Do you have any symptoms? (e.g., cough, runny nose) If Yes, ask: When did the symptoms start?     Runny nose, mild scratchy throat  Protocols used: Common Cold-A-AH, Whooping Cough Exposure-A-AH

## 2024-08-04 NOTE — Telephone Encounter (Signed)
 If she is having increased nasal congestion, drainage, then recommend nasacort  nasal spray - 2 sprays each nostril one time per day. Also saline nasal spray - flush nose 1-2 x/day. If develops increased congestion - robitussin. Keep us  posted on how she is doing.

## 2024-08-04 NOTE — Telephone Encounter (Signed)
 Spoke with pt she stated she sat in the waiting room of a car dealership for 2 hours approximately 2 rows away from the child that was coughing , she is unsure of the child's diagnoses but stated his cough sounded like the whooping cough and googled and saw that the whooping cough was still around   Pt denies SHOB, fever, cough -- stated she did wake up with a runny nose and scratchy throat but that is her normal .. Wants to know the preventive measures she should take to against the whooping cough.

## 2024-08-04 NOTE — Telephone Encounter (Signed)
 Doesn't want to do VV, stated she doesn't have whooping cough just wanted to know preventive measures  also stated her daughter Dorthea has an appt tomorrow at 8 AM and will be with her.SABRA

## 2024-11-21 ENCOUNTER — Other Ambulatory Visit

## 2024-11-25 ENCOUNTER — Ambulatory Visit: Admitting: Internal Medicine

## 2025-06-02 ENCOUNTER — Ambulatory Visit
